# Patient Record
Sex: Female | Born: 1995 | Race: Black or African American | Hispanic: No | Smoking: Never smoker
Health system: Southern US, Community
[De-identification: ages and names within clinical notes are randomized; demographics above are authoritative.]

## PROBLEM LIST (undated history)

## (undated) ENCOUNTER — Inpatient Hospital Stay (HOSPITAL_COMMUNITY): Payer: Self-pay

## (undated) DIAGNOSIS — Z789 Other specified health status: Secondary | ICD-10-CM

## (undated) HISTORY — PX: NO PAST SURGERIES: SHX2092

---
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
## 2018-05-07 NOTE — L&D Delivery Note (Signed)
OB/GYN Faculty Practice Delivery Note  Madeline Garcia is a 23 y.o. G1P0 s/p VAVD at [redacted]w[redacted]d. She was admitted for post dates induction of labor. .   ROM: 5h 66m with clear fluid GBS Status:  Positive (07/14 0000), adequately treated Maximum Maternal Temperature: 99.5 F    Labor Progress: . Induced with cytotec x2, foley balloon, and pit, had SROM at 0600 on day of delivery, and progressed to fully  Delivery Date/Time: 12/23/2018 at 1132  Operative Delivery Note Infant was delivered via Vacuum Assisted Vaginal Delivery due to non-reassuring fetal heart tracing with prolonged decelerations.    Verbal consent: obtained from patient.  Risks and benefits discussed in detail.  Risks include, but are not limited to the risks of anesthesia, bleeding, infection, damage to maternal tissues, fetal cephalhematoma.  There is also the risk of inability to effect vaginal delivery of the head, or shoulder dystocia that cannot be resolved by established maneuvers, leading to the need for emergency cesarean section.  The patient was examined and thought to be ROA at +3 station.The Mityvac was positioned over the sagittal suture 3 cm anterior to posterior fontanelle.  Pressure was then increased to 400 mmHg, and the patient was instructed to push.  Pulling was administered along the pelvic curve.  2 pulls were administered during 1 contractions, with two pop offs at which the point the fetal forehead was identified and the Mityvac was reapplied to the sagittal suture anterior to the posterior fontanelle, and after 1 push without pop off the fetal head was delivered in LOP position. No nuchal cord present. The shoulders were identified to be in a transverse position, shoulder and bodies delivered with gentral traction. Infant with spontaneous cry, placed on mother's abdomen, dried and stimulated. Cord clamped x 2 after 1-minute delay, and cut by father. Cord blood drawn. Placenta delivered spontaneously with  gentle cord traction. Fundus firm with massage and Pitocin. Labia, perineum, vagina, and cervix inspected inspected with no lacertaions.   Placenta: intact, 3v Complications: none Lacerations: none EBL: 230cc Analgesia: epidural   Infant: APGAR (1 MIN): 7   APGAR (5 MINS): 9    Augustin Coupe, MD/[facility] OB/GYN Fellow, Water engineer

## 2018-05-13 ENCOUNTER — Encounter: Payer: Self-pay | Admitting: *Deleted

## 2018-06-06 ENCOUNTER — Inpatient Hospital Stay (HOSPITAL_COMMUNITY)
Admission: AD | Admit: 2018-06-06 | Discharge: 2018-06-07 | Disposition: A | Discharge status: Home / Self Care | Payer: Medicaid Other | Source: Ambulatory Visit | Attending: Family Medicine | Admitting: Family Medicine

## 2018-06-06 ENCOUNTER — Encounter (HOSPITAL_COMMUNITY): Payer: Self-pay | Admitting: *Deleted

## 2018-06-06 ENCOUNTER — Inpatient Hospital Stay (HOSPITAL_COMMUNITY): Payer: Medicaid Other

## 2018-06-06 DIAGNOSIS — R102 Pelvic and perineal pain: Secondary | ICD-10-CM | POA: Insufficient documentation

## 2018-06-06 DIAGNOSIS — O26891 Other specified pregnancy related conditions, first trimester: Secondary | ICD-10-CM | POA: Insufficient documentation

## 2018-06-06 DIAGNOSIS — Z3491 Encounter for supervision of normal pregnancy, unspecified, first trimester: Secondary | ICD-10-CM

## 2018-06-06 DIAGNOSIS — R109 Unspecified abdominal pain: Secondary | ICD-10-CM | POA: Diagnosis not present

## 2018-06-06 DIAGNOSIS — Z3A13 13 weeks gestation of pregnancy: Secondary | ICD-10-CM | POA: Insufficient documentation

## 2018-06-06 DIAGNOSIS — R1030 Lower abdominal pain, unspecified: Secondary | ICD-10-CM | POA: Insufficient documentation

## 2018-06-06 DIAGNOSIS — O219 Vomiting of pregnancy, unspecified: Secondary | ICD-10-CM

## 2018-06-06 HISTORY — DX: Other specified health status: Z78.9

## 2018-06-06 LAB — URINALYSIS, ROUTINE W REFLEX MICROSCOPIC
Bilirubin Urine: NEGATIVE
Glucose, UA: NEGATIVE mg/dL
Hgb urine dipstick: NEGATIVE
Ketones, ur: 15 mg/dL — AB
Leukocytes, UA: NEGATIVE
Nitrite: NEGATIVE
Protein, ur: NEGATIVE mg/dL
SPECIFIC GRAVITY, URINE: 1.025 (ref 1.005–1.030)
pH: 6 (ref 5.0–8.0)

## 2018-06-06 LAB — CBC
HCT: 34.3 % — ABNORMAL LOW (ref 36.0–46.0)
Hemoglobin: 11.4 g/dL — ABNORMAL LOW (ref 12.0–15.0)
MCH: 27.5 pg (ref 26.0–34.0)
MCHC: 33.2 g/dL (ref 30.0–36.0)
MCV: 82.7 fL (ref 80.0–100.0)
NRBC: 0 % (ref 0.0–0.2)
PLATELETS: 327 10*3/uL (ref 150–400)
RBC: 4.15 MIL/uL (ref 3.87–5.11)
RDW: 12.7 % (ref 11.5–15.5)
WBC: 8.6 10*3/uL (ref 4.0–10.5)

## 2018-06-06 LAB — WET PREP, GENITAL
Clue Cells Wet Prep HPF POC: NONE SEEN
Sperm: NONE SEEN
Trich, Wet Prep: NONE SEEN
Yeast Wet Prep HPF POC: NONE SEEN

## 2018-06-06 LAB — POCT PREGNANCY, URINE: Preg Test, Ur: POSITIVE — AB

## 2018-06-06 LAB — HCG, QUANTITATIVE, PREGNANCY: hCG, Beta Chain, Quant, S: 40531 m[IU]/mL — ABNORMAL HIGH (ref <5)

## 2018-06-06 NOTE — MAU Provider Note (Addendum)
History     CSN: [id number]  Arrival date and time: 06/06/18 2025   First Provider Initiated Contact with Patient 06/06/18 2120      Chief Complaint  Patient presents with  . Pelvic Pain  . Emesis During Pregnancy   Madeline Garcia is 23 y.o. G1P0 at [redacted]w[redacted]d by approximate LMP who presents today with lower abodminal pain. She denies any VB or discharge.   Pelvic Pain  The patient's primary symptoms include pelvic pain. The patient's pertinent negatives include no vaginal discharge. This is a new problem. Episode onset: 2 weeks ago. The problem occurs constantly. The problem has been unchanged. Pain severity now: 7/10. The problem affects both sides. She is pregnant. Associated symptoms include nausea. Pertinent negatives include no chills, dysuria, fever, frequency or vomiting. The vaginal discharge was normal. There has been no bleeding. Nothing aggravates the symptoms. She has tried nothing for the symptoms.    OB History    Gravida  1   Para      Term      Preterm      AB      Living        SAB      TAB      Ectopic      Multiple      Live Births              Past Medical History:  Diagnosis Date  . Medical history non-contributory     Past Surgical History:  Procedure Laterality Date  . NO PAST SURGERIES      History reviewed. No pertinent family history.  Social History   Tobacco Use  . Smoking status: Never Smoker  . Smokeless tobacco: Never Used  Substance Use Topics  . Alcohol use: Never    Frequency: Never  . Drug use: Never    Allergies: No Known Allergies  No medications prior to admission.    Review of Systems  Constitutional: Negative for chills and fever.  Gastrointestinal: Positive for nausea. Negative for vomiting.  Genitourinary: Positive for pelvic pain. Negative for dysuria, frequency, vaginal bleeding and vaginal discharge.   Physical Exam   Blood pressure 130/73, pulse (!) 107, temperature 99.1 F (37.3 C),  resp. rate 18, height 5\' 4"  (1.626 m), weight 113.4 kg.  Physical Exam  Nursing note and vitals reviewed. Constitutional: She is oriented to person, place, and time. She appears well-developed and well-nourished. No distress.  HENT:  Head: Normocephalic.  Cardiovascular: Normal rate.  Respiratory: Effort normal.  GI: Soft. There is no abdominal tenderness. There is no rebound.  Neurological: She is alert and oriented to person, place, and time.  Skin: Skin is warm and dry.  Psychiatric: She has a normal mood and affect.    Results for orders placed or performed during the hospital encounter of 06/06/18 (from the past 24 hour(s))  Urinalysis, Routine w reflex microscopic     Status: Abnormal   Collection Time: 06/06/18  8:54 PM  Result Value Ref Range   Color, Urine YELLOW YELLOW   APPearance CLEAR CLEAR   Specific Gravity, Urine 1.025 1.005 - 1.030   pH 6.0 5.0 - 8.0   Glucose, UA NEGATIVE NEGATIVE mg/dL   Hgb urine dipstick NEGATIVE NEGATIVE   Bilirubin Urine NEGATIVE NEGATIVE   Ketones, ur 15 (A) NEGATIVE mg/dL   Protein, ur NEGATIVE NEGATIVE mg/dL   Nitrite NEGATIVE NEGATIVE   Leukocytes, UA NEGATIVE NEGATIVE  Wet prep, genital     Status:  Abnormal   Collection Time: 06/06/18  8:54 PM  Result Value Ref Range   Yeast Wet Prep HPF POC NONE SEEN NONE SEEN   Trich, Wet Prep NONE SEEN NONE SEEN   Clue Cells Wet Prep HPF POC NONE SEEN NONE SEEN   WBC, Wet Prep HPF POC FEW (A) NONE SEEN   Sperm NONE SEEN   Pregnancy, urine POC     Status: Abnormal   Collection Time: 06/06/18  8:58 PM  Result Value Ref Range   Preg Test, Ur POSITIVE (A) NEGATIVE    MAU Course  Procedures  MDM 9:47 PM care turned over to [facility], CNM Labs and [place] pending   Thressa Sheller DNP, CNM  06/06/18  9:47 PM    MDM: [place] with live IUP, c/w LMP dates.  Wet prep and UA wnl, with 15 ketones in UA so some evidence of dehydration. Pt pain may be digestive, STD testing with [facility] pending.   Increase PO fluids, rest, heat, Tylenol PRN.  F/U with prenatal care as planned. Return to MAU for emergencies.  Assessment and Plan    1. Abdominal pain during pregnancy in first trimester   2. Pelvic pain in pregnancy, antepartum, first trimester   3. Normal IUP (intrauterine pregnancy) on prenatal ultrasound, first trimester     P: D/C home  Sharen Counter, CNM 12:06 AM

## 2018-06-06 NOTE — MAU Note (Addendum)
Pain in lower abd for 2 wks. Vomiting for 2 days. Pregnant but does not know LMP. Thinks she may be 3 months. Denies bleeding or vag d/c. No diarrhea. Pt had pos upt 05/01/18

## 2018-06-07 DIAGNOSIS — Z3A13 13 weeks gestation of pregnancy: Secondary | ICD-10-CM

## 2018-06-07 DIAGNOSIS — R109 Unspecified abdominal pain: Secondary | ICD-10-CM

## 2018-06-07 DIAGNOSIS — O26891 Other specified pregnancy related conditions, first trimester: Secondary | ICD-10-CM

## 2018-06-07 LAB — ABO/RH: ABO/RH(D): B POS

## 2018-06-07 MED ORDER — PROMETHAZINE HCL 25 MG PO TABS: 12.5000 mg | ORAL_TABLET | Freq: Four times a day (QID) | ORAL | 2 refills | Status: DC | PRN

## 2018-06-09 LAB — GC/CHLAMYDIA PROBE AMP (~~LOC~~) NOT AT ARMC
Chlamydia: NEGATIVE
Neisseria Gonorrhea: NEGATIVE

## 2018-06-10 ENCOUNTER — Other Ambulatory Visit: Payer: Self-pay

## 2018-06-10 ENCOUNTER — Encounter: Payer: Self-pay | Admitting: *Deleted

## 2018-06-10 ENCOUNTER — Ambulatory Visit (INDEPENDENT_AMBULATORY_CARE_PROVIDER_SITE_OTHER): Payer: Medicaid Other | Admitting: *Deleted

## 2018-06-10 ENCOUNTER — Ambulatory Visit: Payer: Self-pay | Admitting: Clinical

## 2018-06-10 VITALS — BP 128/73 | HR 106 | Wt 247.6 lb

## 2018-06-10 DIAGNOSIS — Z3401 Encounter for supervision of normal first pregnancy, first trimester: Secondary | ICD-10-CM

## 2018-06-10 DIAGNOSIS — Z3400 Encounter for supervision of normal first pregnancy, unspecified trimester: Secondary | ICD-10-CM | POA: Insufficient documentation

## 2018-06-10 NOTE — Progress Notes (Signed)
New Ob intake completed and pregnancy information packet given. Labs drawn and Medicaid Home form completed. Pt declined flu vaccine today. Information sheet given and will consider receiving at a later date. Pt had no c/o.  Initial prenatal visit scheduled on 2/19 @ 0955.

## 2018-06-10 NOTE — BH Specialist Note (Signed)
Integrated Behavioral Health Initial Visit  MRN: [id number] Name: Madeline Garcia  Number of Integrated Behavioral Health Clinician visits:: 1/6 Session Start time: 11:39  Session End time: 11:51 Total time: 15 minutes  Type of Service: Integrated Behavioral Health- Individual/Family Interpretor:No. Interpretor Name and Language: n/a   Warm Hand Off Completed.       SUBJECTIVE: Madeline Garcia is a 23 y.o. female accompanied by n/a Patient was referred by [place] Muta Day, RN for Initial OB introduction to integrated behavioral health services . Patient reports the following symptoms/concerns: Pt states no particular concerns today Duration of problem: n/a; Severity of problem: n/a  OBJECTIVE: Mood: Normal and Affect: Appropriate Risk of harm to self or others: No plan to harm self or others  LIFE CONTEXT: Family and Social: - School/Work: - Self-Care: - Life Changes: Current pregnancy  GOALS ADDRESSED: Patient will: 1. Increase knowledge and/or ability of: healthy habits   INTERVENTIONS: Interventions utilized: Psychoeducation and/or Health Education  Standardized Assessments completed: GAD-7 and PHQ 9  ASSESSMENT: Patient currently experiencing Supervision of low-risk first pregnancy, antepartum   Patient may benefit from Initial OB introduction to integrated behavioral health services.  PLAN: 1. Follow up with behavioral health clinician on : As needed 2. Behavioral recommendations:  -Begin taking prenatal vitamin, as recommended by medical provider 3. Referral(s): Integrated [employer] (In Clinic) 4. "From scale of 1-10, how likely are you to follow plan?": 10  Rae Lips, LCSW  Depression screen [facility] 2/9 06/10/2018  Decreased Interest 0  Down, Depressed, Hopeless 0  PHQ - 2 Score 0  Altered sleeping 1  Tired, decreased energy 1  Change in appetite 0  Feeling bad or failure about yourself  0  Trouble concentrating 0  Moving  slowly or fidgety/restless 0  Suicidal thoughts 0  PHQ-9 Score 2   GAD 7 : Generalized Anxiety Score 06/10/2018  Nervous, Anxious, on Edge 0  Control/stop worrying 0  Worry too much - different things 0  Trouble relaxing 0  Restless 0  Easily annoyed or irritable 0

## 2018-06-11 NOTE — Progress Notes (Signed)
Chart reviewed for nurse visit. Agree with plan of care.   Marny Lowenstein, PA-C 06/11/2018 12:08 PM

## 2018-06-12 LAB — URINE CULTURE, OB REFLEX

## 2018-06-12 LAB — CULTURE, OB URINE

## 2018-06-19 LAB — INHERITEST(R) CF/SMA PANEL

## 2018-06-19 LAB — OBSTETRIC PANEL, INCLUDING HIV
Antibody Screen: NEGATIVE
BASOS ABS: 0 10*3/uL (ref 0.0–0.2)
Basos: 0 %
EOS (ABSOLUTE): 0 10*3/uL (ref 0.0–0.4)
Eos: 0 %
HEP B S AG: NEGATIVE
HIV Screen 4th Generation wRfx: NONREACTIVE
Hematocrit: 34.9 % (ref 34.0–46.6)
Hemoglobin: 11.6 g/dL (ref 11.1–15.9)
Immature Grans (Abs): 0 10*3/uL (ref 0.0–0.1)
Immature Granulocytes: 0 %
Lymphocytes Absolute: 1.8 10*3/uL (ref 0.7–3.1)
Lymphs: 18 %
MCH: 27.6 pg (ref 26.6–33.0)
MCHC: 33.2 g/dL (ref 31.5–35.7)
MCV: 83 fL (ref 79–97)
Monocytes Absolute: 0.6 10*3/uL (ref 0.1–0.9)
Monocytes: 7 %
Neutrophils Absolute: 7.2 10*3/uL — ABNORMAL HIGH (ref 1.4–7.0)
Neutrophils: 75 %
Platelets: 375 10*3/uL (ref 150–450)
RBC: 4.2 x10E6/uL (ref 3.77–5.28)
RDW: 12.9 % (ref 11.7–15.4)
RPR Ser Ql: NONREACTIVE
Rh Factor: POSITIVE
Rubella Antibodies, IGG: 1.34 index (ref >0.99)
WBC: 9.7 10*3/uL (ref 3.4–10.8)

## 2018-06-19 LAB — HEMOGLOBINOPATHY EVALUATION
Ferritin: 110 ng/mL (ref 15–150)
Hgb A2 Quant: 2.3 % (ref 1.8–3.2)
Hgb A: 97.7 % (ref 96.4–98.8)
Hgb C: 0 %
Hgb F Quant: 0 % (ref 0.0–2.0)
Hgb S: 0 %
Hgb Solubility: NEGATIVE
Hgb Variant: 0 %

## 2018-06-25 ENCOUNTER — Encounter: Payer: Self-pay | Admitting: Medical

## 2018-06-25 ENCOUNTER — Other Ambulatory Visit: Payer: Self-pay

## 2018-06-25 ENCOUNTER — Other Ambulatory Visit (HOSPITAL_COMMUNITY)
Admission: RE | Admit: 2018-06-25 | Discharge: 2018-06-25 | Disposition: A | Discharge status: Home / Self Care | Payer: Medicaid Other | Source: Ambulatory Visit | Attending: Medical | Admitting: Medical

## 2018-06-25 ENCOUNTER — Ambulatory Visit (INDEPENDENT_AMBULATORY_CARE_PROVIDER_SITE_OTHER): Payer: Self-pay | Admitting: Medical

## 2018-06-25 DIAGNOSIS — Z3402 Encounter for supervision of normal first pregnancy, second trimester: Secondary | ICD-10-CM | POA: Diagnosis not present

## 2018-06-25 DIAGNOSIS — Z3A15 15 weeks gestation of pregnancy: Secondary | ICD-10-CM

## 2018-06-25 DIAGNOSIS — O99212 Obesity complicating pregnancy, second trimester: Secondary | ICD-10-CM | POA: Insufficient documentation

## 2018-06-25 NOTE — Progress Notes (Signed)
Medicaid Form Completed-06/25/18 

## 2018-06-25 NOTE — Patient Instructions (Signed)
Childbirth Education Options: [facility] Department Classes:  Childbirth education classes can help you get ready for a positive parenting experience. You can also meet other expectant parents and get free stuff for your baby. Each class runs for five weeks on the same night and costs $45 for the mother-to-be and her support person. Medicaid covers the cost if you are eligible. Call [phone] to register. [facility] Childbirth Education:  [phone] or [phone] or [email or web address]  Baby & Me Class: Discuss newborn & infant parenting and family adjustment issues with other new mothers in a relaxed environment. Each week brings a new speaker or baby-centered activity. We encourage new mothers to join [place] every Thursday at 11:00am. Babies birth until crawling. No registration or fee. Daddy [employer]: This course offers Dads-to-be the tools and knowledge needed to feel confident on their journey to becoming new fathers. Experienced dads, who have been trained as coaches, teach dads-to-be how to hold, comfort, diaper, swaddle and play with their infant while being able to support the new mom as well. A class for men taught by men. $25/dad Big Brother/Big Sister: Let your children share in the joy of a new brother or sister in this special class designed just for them. Class includes discussion about how families care for babies: swaddling, holding, diapering, safety as well as how they can be helpful in their new role. This class is designed for children ages 45 to 48, but any age is welcome. Please register each child individually. $5/child  Mom Talk: This mom-led group offers support and connection to mothers as they journey through the adjustments and struggles of that sometimes overwhelming first year after the birth of a child. Tuesdays at 10:00am and Thursdays at 6:00pm. Babies welcome. No registration or fee. Breastfeeding Support Group: This group is a mother-to-mother  support circle where moms have the opportunity to share their breastfeeding experiences. A Lactation Consultant is present for questions and concerns. Meets each Tuesday at 11:00am. No fee or registration. Breastfeeding Your Baby: Learn what to expect in the first days of breastfeeding your newborn.  This class will help you feel more confident with the skills needed to begin your breastfeeding experience. Many new mothers are concerned about breastfeeding after leaving the hospital. This class will also address the most common fears and challenges about breastfeeding during the first few weeks, months and beyond. (call for fee) Comfort Techniques and Tour: This 2 hour interactive class will provide you the opportunity to learn & practice hands-on techniques that can help relieve some of the discomfort of labor and encourage your baby to rotate toward the best position for birth. You and your partner will be able to try a variety of labor positions with birth balls and rebozos as well as practice breathing, relaxation, and visualization techniques. A tour of the [facility] is included with this class. $20 per registrant and support person Childbirth Class- Weekend Option: This class is a Weekend version of our Birth & Baby series. It is designed for parents who have a difficult time fitting several weeks of classes into their schedule. It covers the care of your newborn and the basics of labor and childbirth. It also includes a [place] of [facility] and lunch. The class is held two consecutive days: beginning on Friday evening from 6:30 - 8:30 p.m. and the next day, Saturday from 9 a.m. - 4 p.m. (call for fee) Doren Custard Class: Interested in a waterbirth?  This  informational class will help you discover whether waterbirth is the right fit for you. Education about waterbirth itself, supplies you would need and how to assemble your support team is what you can  expect from this class. Some obstetrical practices require this class in order to pursue a waterbirth. (Not all obstetrical practices offer waterbirth-check with your healthcare provider.) Register only the expectant mom, but you are encouraged to bring your partner to class! Required if planning waterbirth, no fee. Infant/Child CPR: Parents, grandparents, babysitters, and friends learn Cardio-Pulmonary Resuscitation skills for infants and children. You will also learn how to treat both conscious and unconscious choking in infants and children. This Family & Friends program does not offer certification. Register each participant individually to ensure that enough mannequins are available. (Call for fee) Grandparent Love: Expecting a grandbaby? This class is for you! Learn about the latest infant care and safety recommendations and ways to support your own child as he or she transitions into the parenting role. Taught by Registered Nurses who are childbirth instructors, but most importantly...they are grandmothers too! $10/person. Childbirth Class- Natural Childbirth: This series of 5 weekly classes is for expectant parents who want to learn and practice natural methods of coping with the process of labor and childbirth. Relaxation, breathing, massage, visualization, role of the partner, and helpful positioning are highlighted. Participants learn how to be confident in their body's ability to give birth. This class will empower and help parents make informed decisions about their own care. Includes discussion that will help new parents transition into the immediate postpartum period. Maternity Care Center Tour of [facility] is included. We suggest taking this class between 25-32 weeks, but it's only a recommendation. $75 per registrant and one support person or $30 Medicaid. Childbirth Class- 3 week Series: This option of 3 weekly classes helps you and your labor partner prepare for childbirth. Newborn  care, labor & birth, cesarean birth, pain management, and comfort techniques are discussed and a Maternity Care Center Tour of [facility] is included. The class meets at the same time, on the same day of the week for 3 consecutive weeks beginning with the starting date you choose. $60 for registrant and one support person.  Marvelous Multiples: Expecting twins, triplets, or more? This class covers the differences in labor, birth, parenting, and breastfeeding issues that face multiples' parents. NICU tour is included. Led by a Certified Childbirth Educator who is the mother of twins. No fee. Caring for Baby: This class is for expectant and adoptive parents who want to learn and practice the most up-to-date newborn care for their babies. Focus is on birth through the first six weeks of life. Topics include feeding, bathing, diapering, crying, umbilical cord care, circumcision care and safe sleep. Parents learn to recognize symptoms of illness and when to call the pediatrician. Register only the mom-to-be and your partner or support person can plan to come with you! $10 per registrant and support person Childbirth Class- online option: This online class offers you the freedom to complete a Birth and Baby series in the comfort of your own home. The flexibility of this option allows you to review sections at your own pace, at times convenient to you and your support people. It includes additional video information, animations, quizzes, and extended activities. Get organized with helpful eClass tools, checklists, and trackers. Once you register online for the class, you will receive an email within a few days to accept the invitation and begin the class when the time   is right for you. The content will be available to you for 60 days. $60 for 60 days of online access for you and your support people.  Local Doulas: Natural Baby Doulas [email or web address] Tel:  [phone] [email or web address] [employer] [phone] [email or web address] .[email or web address].[email or web address] The Labor Hassell Halim  (also do waterbirth tub rental) [phone] [email or web address] Triad Birth Doula [phone] [email or web address] NotebookDistributors.fi Sacred Rhythms  [phone] https://sacred-rhythms.com/ [employer] (PADA) [email or web address] https://www.frey.org/ [place] Birth and Baby  http://[email or web address]/ Considering Waterbirth? Guide for patients at [facility] [employer]  Why consider waterbirth?  . Gentle birth for babies . Less pain medicine used in labor . May allow for passive descent/less pushing . May reduce perineal tears  . More mobility and instinctive maternal position changes . Increased maternal relaxation . Reduced blood pressure in labor  Is waterbirth safe? What are the risks of infection, drowning or other complications?  . Infection: o Very low risk (3.7 % for tub vs 4.8% for bed) o 7 in 8000 waterbirths with documented infection o Poorly cleaned equipment most common cause o Slightly lower group B strep transmission rate  . Drowning o Maternal:  - Very low risk   - Related to seizures or fainting o Newborn:  - Very low risk. No evidence of increased risk of respiratory problems in multiple large studies - Physiological protection from breathing under water - Avoid underwater birth if there are any fetal complications - Once baby's head is out of the water, keep it out.  . Birth complication o Some reports of cord trauma, but risk decreased by bringing baby to surface gradually o No evidence of increased risk of shoulder dystocia. Mothers can usually change positions faster in water than in a bed, possibly aiding the maneuvers to free the shoulder.   You must attend a Doren Custard class at [facility]  3rd Wednesday of every month from 7-9pm  [employer] by calling [phone] or online at [email or web address]  Bring [place] the certificate from the class to your prenatal appointment  Meet with a midwife at 36 weeks to see if you can still plan a waterbirth and to sign the consent.   Purchase or rent the following supplies:   Water Birth Pool (Birth Pool in a Box or [place] for instance)  (Tubs start ~$125)  Single-use disposable tub liner designed for your brand of tub  New garden hose labeled "lead-free", "suitable for drinking water",  Electric drain pump to remove water (We recommend 792 gallon per hour or greater pump.)   Separate garden hose to remove the dirty water  Fish net  Bathing suit top (optional)  Long-handled mirror (optional)  Places to purchase or rent supplies  GotWebTools.is for tub purchases and supplies  Waterbirthsolutions.com for tub purchases and supplies  The Labor Ladies (www.thelaborladies.com) $275 for tub rental/set-up & take down/kit   [employer] (http://www.fleming.com/.htm) Information regarding doulas (labor support) who provide pool rentals  Our practice has a Birth Pool in a Box tub at the hospital that you may borrow on a first-come-first-served basis. It is your responsibility to to set up, clean and break down the tub. We cannot guarantee the availability of this tub in advance. You are responsible for bringing all accessories listed above. If you do not have all necessary supplies you cannot have a waterbirth.    Things that would prevent you from having a waterbirth:  Premature, <37wks  Previous cesarean birth  Presence of thick meconium-stained fluid  Multiple gestation (Twins,  triplets, etc.)  Uncontrolled diabetes or gestational diabetes requiring medication  Hypertension requiring medication or diagnosis of pre-eclampsia  Heavy vaginal bleeding  Non-reassuring fetal  heart rate  Active infection (MRSA, etc.). Group B Strep is NOT a contraindication for  waterbirth.  If your labor has to be induced and induction method requires continuous  monitoring of the baby's heart rate  Other risks/issues identified by your obstetrical provider  Please remember that birth is unpredictable. Under certain unforeseeable circumstances your provider may advise against giving birth in the tub. These decisions will be made on a case-by-case basis and with the safety of you and your baby as our highest priority.   AREA PEDIATRIC/FAMILY PRACTICE PHYSICIANS  [place]outheast [employer] [phone]) . [facility] Davy Pique, MD; Gwendlyn Deutscher, MD; Walker Kehr, MD; Andria Frames, MD; McDiarmid, MD; Dutch Quint, MD; Nori Riis, MD; Mingo Amber, [place] [place], [place] [address] o [phone] o Mon-Fri 8:30-12:30, 1:30-5:00 o Providers come to see babies at [facility] o Accepting Medicaid . [place] [facility] [place] providers who accept newborns: Dorthy Cooler, MD; Orland Mustard, MD; Stephanie Acre, MD o [place], [place], [place] [address] o [phone] o Mon-Fri 8:00-5:30 o Babies seen by providers at [facility] o Does NOT accept Medicaid o Please call early in hospitalization for appointment (limited availability)  . [place]ard Royal Pines, MD o [address] [place], [place] [address] o [phone] o Mon, Tue, Thur, Fri 8:30-5:00, Wed 10:00-7:00 (closed 1-2pm) o Babies seen by [facility] providers o Accepting Medicaid . [place], MD o [place], [place], [place] [address] o [phone] o Mon-Fri 8:30-5:00, Sat 8:30-12:00 o Provider comes to see babies at [place] Medicaid o Must have been referred from current patients or contacted office prior to delivery . [place] [facility] ([place] for  [place]) Franne Forts, MD; Tamera Punt, MD; Doneen Poisson, MD; Fatima Sanger, MD; Wynetta Emery, MD; Jess Barters, MD; Tami Ribas, MD; Herbert Moors, MD; Derrell Lolling, MD; Dorothyann Peng, MD; Lucious Groves, NP; Baldo Ash, NP o [place], [place], [place] [address] o [phone] o Mon, Tue, Thur, Fri 8:30-5:30, Wed 9:30-5:30, Sat 8:30-12:30 o Babies seen by [facility] providers o Accepting Medicaid o Only accepting infants of first-time parents or siblings of current patients [facility] discharge coordinator will make follow-up appointment . Baltazar Najjar o [address], [place], [place]  [address] o [phone]   Fax - [phone] . [facility] o [address], [place], [place]  [address] o Phone - [phone]   Fax [phone] . [place], [place], [place], [place]  [address] o [phone]  [place] [phone]) . [facility] Reginal Lutes, MD; Jacklynn Ganong, MD; Torrie Mayers, MD; MD; Rosana Hoes, MD; Servando Salina, MD; Rose Fillers, MD; Rex Kras, MD; Corinna Capra, MD; Volney American, MD; Trilby Drummer, MD; Janann Colonel, MD; Jimmye Norman, [place], [place], [place] [address] o [phone] o Mon-Fri 8:30-5:00 (extended evenings Mon-Thur as needed), Sat-Sun 10:00-1:00 o Providers come to see babies at [place] Medicaid for families of first-time babies and families with all children in the household age 20 and under. Must register with office prior to making appointment (M-F only). . [place], NP; Tomi Bamberger, MD; Redmond School, MD; [place], [place] [place], [place] [address] o [phone] o Mon-Fri 8:00-5:00 o Babies seen by providers at [facility] o Does NOT accept  Medicaid/Commercial Insurance Only . [facility] [place] ([facility])  Marnee Guarneri, MD; Drema Dallas, MD; Montine Circle, MD; Vilma Prader, MD; Vanita Panda, MD; Alfonso Ramus, MD; Ruthann Cancer, MD; Roxanne Mins, MD; Rosalva Ferron, MD; Polly Cobia, MD o [address]  [place], [place] [address] o [phone] o Mon-Fri 8:30-5:30, Sat (Oct.-Mar.) 9:00-1:00 o Babies seen by providers at [place] [phone]) . [facility] Peers, MD; Suzan Slick, MD o [address], [place], [place] [address] o [phone] o Mon-Fri 8:30-5:00, Sat 8:30-12:00 o Providers come to see babies at [facility] o Does NOT accept Medicaid . [place] at [place], [place]; [place], MD; [place], [place]; Nancy Fetter, MD; Moreen Fowler, [place], [place], [place] [address] o [phone] o Mon-Fri 8:00-5:00 o Babies seen by providers at [facility] o Does NOT accept Medicaid o Only accepting babies of parents who are patients o Please call early in hospitalization for appointment (limited availability) . [facility] Pediatricians Blanca Friend, MD; Sharlene Motts, MD; Rod Can, MD; Warner Mccreedy, NP; Sabra Heck, MD; Ermalinda Memos, MD; Sharlett Iles, NP; Aurther Loft, MD; Jerrye Beavers, MD; Marcello Moores, MD; Berline Lopes, MD; Charolette Forward, MD o [place]. [place], [place], [place] [address] o [phone] o Mon-Fri 8:00-5:00, Sat 9:00-12:00 o Providers come to see babies at [facility] o Does NOT accept [facility] [phone]) . [place] [facility] [place] providers accepting new patients: Dayna Ramus, NP; [place], [place], [place], [place] [address] o [phone] o Mon-Fri 8:00-5:00 o Babies seen by providers at [facility] o Does NOT accept Medicaid o Only accepting babies of parents who are patients o Please call early in hospitalization for appointment (limited availability) . [facility] Oswaldo Conroy, MD; Sheran Lawless, MD o [address] [place], [place] [address] o [phone] (press 1 to schedule appointment) o Mon-Fri 8:00-5:00 o Providers come to see babies at [facility] o Does NOT accept Medicaid . [facility] Jodi Mourning, MD o [address], [place], [place]  [address] o [phone] o Mon-Fri 8:30-5:00 (lunch 12:30-1:00), extended hours by appointment only Wed 5:00-6:30 o Babies seen by [facility] providers o Accepting Medicaid . [place] at Evalyn Casco, MD; [place], MD; Ethlyn Gallery, MD o [place], [place], [place] [address] o [phone] o Mon-Fri 8:00-5:00 o Babies seen by [facility] providers o Does NOT accept Medicaid . Therapist, music at [place], MD; Yong Channel, MD; [place], [place] [address] [place], [place] [address] o [phone] o Mon-Fri 8:00-5:00 o Babies seen by [facility] providers o Does NOT accept Medicaid . [place], [place]; [place], [place]; Wheeler, NP; Albertina Parr, MD; Frederic Jericho, MD; Ronney Lion, MD; Carlos Levering, NP; Jerelene Redden, NP; Tomasita Crumble, NP; Ronelle Nigh, NP; Corinna Lines, MD; Windber, MD o [address] [place], [place] [address] o [phone] o Mon-Fri 8:30-5:00, Sat 10:00-1:00 o Providers come to see babies at [facility] o Does NOT accept Medicaid o Free prenatal information session Tuesdays at 4:45pm . [facility] Porfirio Oar, MD; [place], [place]; [place], [place]; [place], [place] [place] [address] o [phone] o Mon-Fri 7:30-5:30 o Babies seen by [facility] providers . [facility] Children's Doctor o [address] [place], [place],   [address] o [phone]   Fax - [phone]  [place] [phone] & [phone]) . [place], MD o [address] [place],  [address] o [phone] o Mon-Thur  8:00-6:00 o Providers come to see babies at [facility] o Accepting Medicaid . [facility] o Anderson, NP; Badger, MD; Beal, PA; Spencer, PA o [address] [place], [place] [address] o [phone] o Mon-Thur 7:30-7:30, Fri 7:30-4:30 o Babies seen by [facility] providers o Accepting Medicaid . [facility] o Agbuya, MD; Klett,  NP; Romgoolam, MD o [address], [place], [place] [address] o [phone] o Mon-Fri 8:30-5:00, Sat 8:30-12:00 o Providers come to see babies at [facility] o Accepting Medicaid o Must have "Meet & Greet" appointment at office prior to delivery . [facility] ([facility]) o McCord, MD; [place], MD; Wood, MD o [address], [place], [place] [address] o [phone] o Mon-Wed 8:00-6:00, Thur-Fri 8:00-5:00, Sat 9:00-12:00 o Providers come to see babies at [facility] o Does NOT accept Medicaid o Only accepting siblings of current patients . [facility]  o [address], [place], [place]  [address] o [phone]   Fax - [phone] . [facility] [place] o [address], [place], [place]  [address] o [phone]   Fax - [phone]  [place] ([address] & [address]) . [facility] [place] o Cirigliano, DO; Matthews, DO o [address] [place], [place] [address] o [phone] o Mon-Fri 7:00-5:00 o Babies seen by [facility] providers o Does NOT accept Medicaid . [facility] o Briscoe, MD; Howley, PA; Moreira, PA o [address], [place], [place] [address] o [phone] o Mon-Fri 8:00-5:00 o Babies seen by [facility] providers o Accepting Medicaid . [facility] o Boyd, MD; Church, PA; Jones, NP; Osborn, PA o [address], [place], [place] [address] o [phone] o Mon-Fri 8:00-5:00 o Babies seen by providers at [facility] o Accepting Medicaid  [place] ([address]) . [facility] [place] o Wendling, DO o [address] [place], [place] [address] o [phone] o Mon-Fri 8:00-5:00 o Babies seen by [facility] providers o Does NOT accept  Medicaid o Limited availability, please call early in hospitalization to schedule follow-up . [facility] o Calderon, PA; Cummings, MD; Dillard, MD; Martin, PA; Olson, MD; VanDeven, PA o [address], [place], [place] [address] o [phone] o Mon-Fri 8:30-5:00, Sat 9:00-12:00 o Babies seen by providers at [facility] o Accepting Medicaid o Please register online then schedule online or call office o [email or web address] . [facility] ([facility] o Hunter, NP; Kumar, MD; Martin Rogers, PA o [address], [place], [place] [address] o [phone] o Mon-Fri 8:00-5:00 o Babies seen by providers at [facility] o Accepting Medicaid . [facility] ([facility]) o Palomas, MD; Kristi Fleenor, NP; [place], MD o [address], [place], [place] [address] o [phone] o Mon-Fri 8:00-5:30, Sat&Sun by appointment (phones open at 8:30) o Babies seen by [facility] providers o Accepting Medicaid o Must be a first-time baby or sibling of current patient . [facility] [place]  o [address], [place],   [address] o [phone]   Fax - [phone]  [place] ([address]) . [facility] o Brown, PA; [place], PA; Rice, MD; Helton, PA; Spry, MD o [address] [place], [place] [address] o [phone] o Mon-Thur 8:00-7:00, Fri 8:00-5:00, Sat 8:00-12:00, Sun 9:00-12:00 o Babies seen by [facility] providers o Accepting Medicaid . [facility] at [facility], MD; Ruthann Cancer, MD; [facility], MD o [address], [place], [place] [address] o [phone] o Mon-Thur 8:00-5:00 o Babies seen by providers at [facility] o Accepting Medicaid . [facility] at [place], MD; Coe-Goins, MD; Amedeo Plenty,  MD; Bobby Rumpf, MD; List, MD; Lavonia Drafts, MD; Ruthann Cancer, MD; Selinda Eon, MD; Audie Box, MD; Jim Like, MD; Christie Nottingham, MD; Hubbard Hartshorn, MD; Modena Nunnery, MD o [address] [place], [place] [address] o [phone] o Mon-Fri 8:00-5:30, Sat (Oct.-Mar.) 9:00-1:00 o Babies seen by providers at [facility] o Accepting Medicaid o Must fill out new patient packet, available online at [email or web address] . [place] ([facility] at [facility]) Barnabas Lister, NP; Kenton Kingfisher, NP; Claiborne Billings, NP; Rolla Plate, MD; [place], [place]; Carola Rhine, MD; Tyron Russell, MD; Delia Chimes, NP o [address], [place], [place] [address] o [phone] o Mon-Thur 8:00-5:30, Fri 8:00-5:00 o Babies seen by providers at [place] [phone]) . [place], [place]; [place], MD; Dennard Schaumann, MD; [place], [place] o [address] [place], [place] [address] o [phone] o Mon-Fri 8:00-5:00 o Babies seen by providers at [place] [phone]) . [place] at [place], [place]; Olen Pel, MD; [place], [place], [place], [place] [address] o [phone] o Mon-Fri 8:00-5:00 o Babies seen by providers at [facility] o Does NOT accept Medicaid o Limited appointment availability, please call early in hospitalization  . Therapist, music at [place], [place]; [place], [address] [place], [place] [address] o [phone] o Mon-Fri 8:00-5:00 o Babies seen by [facility] providers o Does NOT accept Medicaid . [facility] Su Grand, MD; Guy Sandifer, MD; [place], [place]; [place], [place], [place], [place] [address] o [phone] o Mon-Fri 8:00-5:00 o After hours clinic [facility] [place], [place] [address]) [phone] Mon-Fri 5:00-8:00, Sat 12:00-6:00, Sun 10:00-4:00 o Babies seen by [facility]  providers o Accepting Medicaid . [place] at [facility] o [address], [place], [place]  [address] o [phone]   Fax - [phone]  [place] [phone]) . Therapist, music at [facility], MD o [address] [place], [place], [place] [address] o [phone] o Mon-Fri 8:00-5:00 o Babies seen by [facility] providers o Does NOT accept Medicaid . [place] ([place] at [place]) Bing Neighbors, MD o [address] [place], [place], [place] [address] o [phone] o Mon-Thur 8:00-7:00, Fri 8:00-5:00, Sat 8:00-12:00 o Babies seen by providers at [facility] o Accepting Medicaid - but does not have vaccinations in office (must be received elsewhere) o Limited availability, please call early in hospitalization  [place] ([id number]) . [place], [place] [address], [place] [address] o [phone]  Fax [phone]

## 2018-06-25 NOTE — Progress Notes (Signed)
   PRENATAL VISIT NOTE  Subjective:  Madeline Garcia is a 23 y.o. G1P0 at [redacted]w[redacted]d being seen today for her first prenatal care visit.  She had RN new OB intake previously. She is currently monitored for the following issues for this low-risk pregnancy and has Supervision of low-risk first pregnancy and Obesity affecting pregnancy in second trimester on their problem list.  Patient reports nausea and abdominal pain.  Contractions: Not present. Vag. Bleeding: None.  Movement: Absent. Denies leaking of fluid.   The following portions of the patient's history were reviewed and updated as appropriate: allergies, current medications, past family history, past medical history, past social history, past surgical history and problem list. Problem list updated.  Objective:   Vitals:   06/25/18 1009  BP: 129/79  Pulse: (!) 115  Weight: 247 lb 12.8 oz (112.4 kg)    Fetal Status: Fetal Heart Rate (bpm): 156   Movement: Absent     General:  Alert, oriented and cooperative. Patient is in no acute distress.  Skin: Skin is warm and dry. No rash noted.   Cardiovascular: Normal heart rate and rhythm noted  Respiratory: Normal respiratory effort, no problems with respiration noted. Clear to auscultation.   Abdomen: Soft, gravid, appropriate for gestational age. Non tender. Normal bowel sounds  Pain/Pressure: Present     Pelvic: Cervical exam performed Dilation: Closed Effacement (%): Thick    Extremities: Normal range of motion.  Edema: None  Mental Status: Normal mood and affect. Normal behavior. Normal judgment and thought content.   Assessment and Plan:  Pregnancy: G1P0 at [redacted]w[redacted]d  1. Encounter for supervision of low-risk first pregnancy in second trimester - normal labs reviewed from NOB intake visit  - Genetic Screening - Cytology - PAP - [place] MFM OB COMP + 14 WK; scheduled - AFP, Serum, Open Spina Bifida  2. Obesity affecting pregnancy in second trimester - Recommended 81 mg ASA daily -  Discussed appropriate weight gain goals for this pregnancy  Preterm labor/second trimester warning symptoms and general obstetric precautions including but not limited to vaginal bleeding, contractions, leaking of fluid and fetal movement were reviewed in detail with the patient. Please refer to After Visit Summary for other counseling recommendations.  Return in about 4 weeks (around 07/23/2018) for LOB.  Future Appointments  Date Time Provider Department Center  07/22/2018 10:45 AM WH-MFC [place] 2 WH-MFCUS MFC-US    Vonzella Nipple, PA-C

## 2018-06-26 LAB — CYTOLOGY - PAP: DIAGNOSIS: NEGATIVE

## 2018-06-27 LAB — AFP, SERUM, OPEN SPINA BIFIDA
AFP MOM: 0.97
AFP VALUE AFPOSL: 22.9 ng/mL
Gest. Age on Collection Date: 15 weeks
Maternal Age At EDD: 25.2 yr
OSBR Risk 1 IN: 10000
Test Results:: NEGATIVE
WEIGHT: 247 [lb_av]

## 2018-07-04 ENCOUNTER — Telehealth: Payer: Self-pay

## 2018-07-04 NOTE — Telephone Encounter (Signed)
Received call from Pt thru Nurse Line on 07/03/18 stating that she has not received information regarding her babys gender yet.Asked for call back at [phone].

## 2018-07-04 NOTE — Telephone Encounter (Signed)
Called Pt to advise that her Panorama results has not been received , can take up to 7 to 14 days, no answer, left VM.

## 2018-07-08 ENCOUNTER — Encounter: Payer: Self-pay | Admitting: General Practice

## 2018-07-15 ENCOUNTER — Encounter (HOSPITAL_COMMUNITY): Payer: Self-pay

## 2018-07-22 ENCOUNTER — Other Ambulatory Visit: Payer: Self-pay | Admitting: Medical

## 2018-07-22 ENCOUNTER — Ambulatory Visit (HOSPITAL_COMMUNITY)
Admission: RE | Admit: 2018-07-22 | Discharge: 2018-07-22 | Disposition: A | Discharge status: Home / Self Care | Payer: Medicaid Other | Source: Ambulatory Visit | Attending: Medical | Admitting: Medical

## 2018-07-22 ENCOUNTER — Other Ambulatory Visit: Payer: Self-pay

## 2018-07-22 ENCOUNTER — Encounter: Payer: Self-pay | Admitting: *Deleted

## 2018-07-22 ENCOUNTER — Other Ambulatory Visit (HOSPITAL_COMMUNITY): Payer: Self-pay | Admitting: *Deleted

## 2018-07-22 DIAGNOSIS — O99212 Obesity complicating pregnancy, second trimester: Secondary | ICD-10-CM

## 2018-07-22 DIAGNOSIS — Z3402 Encounter for supervision of normal first pregnancy, second trimester: Secondary | ICD-10-CM | POA: Diagnosis present

## 2018-07-22 DIAGNOSIS — Z3A19 19 weeks gestation of pregnancy: Secondary | ICD-10-CM

## 2018-07-22 DIAGNOSIS — Z363 Encounter for antenatal screening for malformations: Secondary | ICD-10-CM

## 2018-07-22 DIAGNOSIS — Z362 Encounter for other antenatal screening follow-up: Secondary | ICD-10-CM

## 2018-07-23 ENCOUNTER — Encounter: Payer: Self-pay | Admitting: Medical

## 2018-07-23 ENCOUNTER — Ambulatory Visit (INDEPENDENT_AMBULATORY_CARE_PROVIDER_SITE_OTHER): Payer: Medicaid Other | Admitting: Medical

## 2018-07-23 ENCOUNTER — Telehealth: Payer: Self-pay

## 2018-07-23 VITALS — BP 130/76 | HR 107 | Temp 98.4°F | Wt 249.8 lb

## 2018-07-23 DIAGNOSIS — Z3402 Encounter for supervision of normal first pregnancy, second trimester: Secondary | ICD-10-CM

## 2018-07-23 DIAGNOSIS — O99212 Obesity complicating pregnancy, second trimester: Secondary | ICD-10-CM

## 2018-07-23 DIAGNOSIS — Z3A19 19 weeks gestation of pregnancy: Secondary | ICD-10-CM

## 2018-07-23 NOTE — Patient Instructions (Addendum)

## 2018-07-23 NOTE — Telephone Encounter (Signed)
Called pt to see if she's coming to appt. Today or would like to reschedule. No answer, left VM with current check in process.

## 2018-07-23 NOTE — Progress Notes (Signed)
   PRENATAL VISIT NOTE  Subjective:  Madeline Garcia is a 23 y.o. G1P0 at [redacted]w[redacted]d being seen today for ongoing prenatal care.  She is currently monitored for the following issues for this low-risk pregnancy and has Supervision of low-risk first pregnancy and Obesity affecting pregnancy in second trimester on their problem list.  Patient reports lower abdominal pain.  Contractions: Not present. Vag. Bleeding: None.  Movement: Present. Denies leaking of fluid.   The following portions of the patient's history were reviewed and updated as appropriate: allergies, current medications, past family history, past medical history, past social history, past surgical history and problem list.   Objective:   Vitals:   07/23/18 1602  BP: 130/76  Pulse: (!) 107  Temp: 98.4 F (36.9 C)  Weight: 249 lb 12.8 oz (113.3 kg)    Fetal Status: Fetal Heart Rate (bpm): 152   Movement: Present     General:  Alert, oriented and cooperative. Patient is in no acute distress.  Skin: Skin is warm and dry. No rash noted.   Cardiovascular: Normal heart rate noted  Respiratory: Normal respiratory effort, no problems with respiration noted  Abdomen: Soft, gravid, appropriate for gestational age.  Pain/Pressure: Present     Pelvic: Cervical exam deferred        Extremities: Normal range of motion.  Edema: None  Mental Status: Normal mood and affect. Normal behavior. Normal judgment and thought content.   Assessment and Plan:  Pregnancy: G1P0 at [redacted]w[redacted]d 1. Encounter for supervision of low-risk first pregnancy in second trimester - Encouraged patient to call ahead if experiencing respiratory symptoms with fever so that she can be triaged and tested/directed appropriately   2. Obesity affecting pregnancy in second trimester - ASA recommended daily  - FU growth scheduled 4/14  Preterm labor/second trimester warning symptoms and general obstetric precautions including but not limited to vaginal bleeding,  contractions, leaking of fluid and fetal movement were reviewed in detail with the patient. Please refer to After Visit Summary for other counseling recommendations.   Return in about 8 weeks (around 09/17/2018) for LOB.  Future Appointments  Date Time Provider Department Center  08/19/2018  3:45 PM WH-MFC [place] 2 WH-MFCUS MFC-US    Vonzella Nipple, PA-C

## 2018-07-29 ENCOUNTER — Encounter: Payer: Self-pay | Admitting: *Deleted

## 2018-08-04 ENCOUNTER — Telehealth: Payer: Self-pay | Admitting: Medical

## 2018-08-04 NOTE — Telephone Encounter (Signed)
Attempted to call patient with her virtual ob appointment. No answer, lvm with the appointment time and date. Advised to give the office a call if needing to reschedule.

## 2018-08-06 ENCOUNTER — Telehealth: Payer: Self-pay | Admitting: *Deleted

## 2018-08-06 NOTE — Telephone Encounter (Signed)
Received a voice message from [place] at [facility] that she needs to know the EDD of this patient and if she is working with these restrictions for duration of pregnancy.

## 2018-08-07 NOTE — Telephone Encounter (Signed)
Was there a phone number? If not how can I contact her back?

## 2018-08-13 ENCOUNTER — Telehealth: Payer: Self-pay | Admitting: Emergency Medicine

## 2018-08-13 NOTE — Telephone Encounter (Signed)
Received a call from [place] from [employer], the employer for [place], regarding her work restriction letter. There was clarification needed regarding prolonged standing and limited bending.   After consulting with Dr Macon Large, the call was returned to [facility] for clarification. I clarified that in regards to the letter, the patient should take a break every two hours so that can be applied to prolonged standing. I also clarified that the pt could not bend to lift anything 20 pounds or greater. No further questions at the time of this discussion.

## 2018-08-14 NOTE — Telephone Encounter (Signed)
Completed on 08/13/18 see new encounter

## 2018-08-19 ENCOUNTER — Other Ambulatory Visit: Payer: Self-pay

## 2018-08-19 ENCOUNTER — Ambulatory Visit (HOSPITAL_COMMUNITY)
Admission: RE | Admit: 2018-08-19 | Discharge: 2018-08-19 | Disposition: A | Discharge status: Home / Self Care | Payer: Medicaid Other | Source: Ambulatory Visit | Attending: Obstetrics and Gynecology | Admitting: Obstetrics and Gynecology

## 2018-08-19 DIAGNOSIS — Z3A23 23 weeks gestation of pregnancy: Secondary | ICD-10-CM | POA: Diagnosis not present

## 2018-08-19 DIAGNOSIS — O99212 Obesity complicating pregnancy, second trimester: Secondary | ICD-10-CM | POA: Diagnosis not present

## 2018-08-19 DIAGNOSIS — Z362 Encounter for other antenatal screening follow-up: Secondary | ICD-10-CM

## 2018-08-26 ENCOUNTER — Encounter: Payer: Self-pay | Admitting: Advanced Practice Midwife

## 2018-08-26 ENCOUNTER — Ambulatory Visit (INDEPENDENT_AMBULATORY_CARE_PROVIDER_SITE_OTHER): Payer: Medicaid Other | Admitting: Advanced Practice Midwife

## 2018-08-26 DIAGNOSIS — Z3A24 24 weeks gestation of pregnancy: Secondary | ICD-10-CM | POA: Diagnosis not present

## 2018-08-26 DIAGNOSIS — Z3402 Encounter for supervision of normal first pregnancy, second trimester: Secondary | ICD-10-CM | POA: Diagnosis not present

## 2018-08-26 NOTE — Progress Notes (Signed)
I connected with  Jacquelynn Cree on 08/26/18 at  2:35 PM EDT by telephone and verified that I am speaking with the correct person using two identifiers.   I discussed the limitations, risks, security and privacy concerns of performing an evaluation and management service by telephone and the availability of in person appointments. I also discussed with the patient that there may be a patient responsible charge related to this service. The patient expressed understanding and agreed to proceed.  Janene Madeira Sanda Dejoy, CMA 08/26/2018  2:19 PM   Set pt up for Baby Rx/ will call back for verification that email was received.  Explained 28 week visit to patient.

## 2018-08-26 NOTE — Progress Notes (Signed)
   TELEHEALTH VIRTUAL OBSTETRICS VISIT ENCOUNTER NOTE  I connected with Jacquelynn Cree on 08/26/18 at  2:35 PM EDT by telephone at home and verified that I am speaking with the correct person using two identifiers.   I discussed the limitations, risks, security and privacy concerns of performing an evaluation and management service by telephone and the availability of in person appointments. I also discussed with the patient that there may be a patient responsible charge related to this service. The patient expressed understanding and agreed to proceed.  Subjective:  CORNELIOUS JEREMIAH is a 23 y.o. G1P0 at [redacted]w[redacted]d being followed for ongoing prenatal care.  She is currently monitored for the following issues for this low-risk pregnancy and has Supervision of low-risk first pregnancy and Obesity affecting pregnancy in second trimester on their problem list.  Patient reports no complaints. Reports fetal movement. Denies any contractions, bleeding or leaking of fluid.   The following portions of the patient's history were reviewed and updated as appropriate: allergies, current medications, past family history, past medical history, past social history, past surgical history and problem list.   Objective:   General:  Alert, oriented and cooperative.   Mental Status: Normal mood and affect perceived. Normal judgment and thought content.  Rest of physical exam deferred due to type of encounter  Assessment and Plan:  Pregnancy: G1P0 at [redacted]w[redacted]d 1. Encounter for supervision of low-risk first pregnancy in second trimester - [facility] AMB BABYSCRIPTS SCHEDULE OPTIMIZATION - CBC; Future - HIV Antibody (routine testing w rflx); Future - RPR; Future - Glucose Tolerance, 2 Hours w/1 Hour; Future - Patient has [place] appt on 09/23/2018 she would like to have labs and next OB visit on the same day if possible. Message sent to front office staff to coordinate visits. - Patient received email while on phone. Will  set up babyscripts app. Patient will send mychart message if she does not get a blood pressure cuff in the mail within about one week of setting up babyscripts account. She has medicaid, so rx can be sent to her pharmacy if needed.    Preterm labor symptoms and general obstetric precautions including but not limited to vaginal bleeding, contractions, leaking of fluid and fetal movement were reviewed in detail with the patient.  I discussed the assessment and treatment plan with the patient. The patient was provided an opportunity to ask questions and all were answered. The patient agreed with the plan and demonstrated an understanding of the instructions. The patient was advised to call back or seek an in-person office evaluation/go to [facility] at [facility] for any urgent or concerning symptoms. Please refer to After Visit Summary for other counseling recommendations.   I provided 10 minutes of non-face-to-face time during this encounter.  Return in about 4 weeks (around 09/23/2018) for 28 week labs and GTT and in- person visit .  Future Appointments  Date Time Provider Department Center  09/23/2018  3:00 PM WH-MFC [place] 3 WH-MFCUS MFC-US    Thressa Sheller, CNM Center for [employer], [facility]

## 2018-08-27 ENCOUNTER — Encounter: Payer: Self-pay | Admitting: Obstetrics & Gynecology

## 2018-08-27 ENCOUNTER — Encounter: Payer: Self-pay | Admitting: Medical

## 2018-09-04 ENCOUNTER — Telehealth: Payer: Self-pay | Admitting: *Deleted

## 2018-09-04 NOTE — Telephone Encounter (Signed)
Called pt regarding babyscripts.  Per chart review, pt has never logged a blood pressure and the BP cuff was delivered.  Pt stated that she was not currently at home but would take a BP and log it into the app when she did get home.  Pt educated on how to take a BP, including proper position and resting 10-15 minutes prior to taking that BP.  Advised pt to take BP weekly and log it into the app. Requesting pt call clinic if she had any difficulties.  Pt verbalized understanding.

## 2018-09-22 ENCOUNTER — Telehealth: Payer: Self-pay | Admitting: Obstetrics and Gynecology

## 2018-09-22 ENCOUNTER — Other Ambulatory Visit (HOSPITAL_COMMUNITY): Payer: Self-pay | Admitting: *Deleted

## 2018-09-22 DIAGNOSIS — Z362 Encounter for other antenatal screening follow-up: Secondary | ICD-10-CM

## 2018-09-22 NOTE — Telephone Encounter (Signed)
Called the patient to inform of upcoming appointment, new location, fastening, and restrictions due to COVID19.

## 2018-09-23 ENCOUNTER — Other Ambulatory Visit (HOSPITAL_COMMUNITY): Payer: Self-pay | Admitting: *Deleted

## 2018-09-23 ENCOUNTER — Ambulatory Visit (INDEPENDENT_AMBULATORY_CARE_PROVIDER_SITE_OTHER): Payer: Medicaid Other | Admitting: Student

## 2018-09-23 ENCOUNTER — Ambulatory Visit (HOSPITAL_COMMUNITY): Payer: Medicaid Other

## 2018-09-23 ENCOUNTER — Ambulatory Visit (HOSPITAL_COMMUNITY)
Admission: RE | Admit: 2018-09-23 | Discharge: 2018-09-23 | Disposition: A | Discharge status: Home / Self Care | Payer: Medicaid Other | Source: Ambulatory Visit | Attending: Maternal & Fetal Medicine | Admitting: Maternal & Fetal Medicine

## 2018-09-23 ENCOUNTER — Other Ambulatory Visit: Payer: Medicaid Other

## 2018-09-23 ENCOUNTER — Other Ambulatory Visit: Payer: Self-pay

## 2018-09-23 VITALS — BP 119/81 | HR 101 | Temp 98.8°F | Wt 259.1 lb

## 2018-09-23 DIAGNOSIS — Z3A28 28 weeks gestation of pregnancy: Secondary | ICD-10-CM

## 2018-09-23 DIAGNOSIS — Z23 Encounter for immunization: Secondary | ICD-10-CM

## 2018-09-23 DIAGNOSIS — Z3402 Encounter for supervision of normal first pregnancy, second trimester: Secondary | ICD-10-CM

## 2018-09-23 DIAGNOSIS — O99213 Obesity complicating pregnancy, third trimester: Secondary | ICD-10-CM

## 2018-09-23 DIAGNOSIS — Z362 Encounter for other antenatal screening follow-up: Secondary | ICD-10-CM | POA: Insufficient documentation

## 2018-09-23 NOTE — Progress Notes (Signed)
   PRENATAL VISIT NOTE  Subjective:  Madeline Garcia is a 23 y.o. G1P0 at [redacted]w[redacted]d being seen today for ongoing prenatal care.  She is currently monitored for the following issues for this low-risk pregnancy and has Supervision of low-risk first pregnancy and Obesity affecting pregnancy in second trimester on their problem list.  Patient reports no complaints.  Contractions: Not present. Vag. Bleeding: None.  Movement: Present. Denies leaking of fluid.   The following portions of the patient's history were reviewed and updated as appropriate: allergies, current medications, past family history, past medical history, past social history, past surgical history and problem list.   Objective:   Vitals:   09/23/18 0832  BP: 119/81  Pulse: (!) 101  Temp: 98.8 F (37.1 C)  Weight: 259 lb 1.6 oz (117.5 kg)    Fetal Status: Fetal Heart Rate (bpm): 140 Fundal Height: 28 cm Movement: Present     General:  Alert, oriented and cooperative. Patient is in no acute distress.  Skin: Skin is warm and dry. No rash noted.   Cardiovascular: Normal heart rate noted  Respiratory: Normal respiratory effort, no problems with respiration noted  Abdomen: Soft, gravid, appropriate for gestational age.  Pain/Pressure: Absent     Pelvic: Cervical exam deferred        Extremities: Normal range of motion.  Edema: None  Mental Status: Normal mood and affect. Normal behavior. Normal judgment and thought content.   Assessment and Plan:  Pregnancy: G1P0 at [redacted]w[redacted]d 1. Encounter for supervision of low-risk first pregnancy in second trimester - Tdap vaccine greater than or equal to 7yo IM -2 hour gtt in process -[place] follow up this afternoon  -BPs on baby scripts have been normal; patient will check on Friday although she missed last week -She will check on Fridays     Preterm labor symptoms and general obstetric precautions including but not limited to vaginal bleeding, contractions, leaking of fluid and fetal  movement were reviewed in detail with the patient. Please refer to After Visit Summary for other counseling recommendations.   Return in about 4 weeks (around 10/21/2018), or My chart virtual visit.  Future Appointments  Date Time Provider Department Center  09/23/2018  9:30 AM WOC-WOCA LAB WOC-WOCA WOC  09/23/2018  3:00 PM WH-MFC [place] 3 WH-MFCUS MFC-US    Marylene Land, CNM

## 2018-09-23 NOTE — Patient Instructions (Signed)
Glucose Tolerance Test During Pregnancy Why am I having this test? The glucose tolerance test (GTT) is done to check how your body processes sugar (glucose). This is one of several tests used to diagnose diabetes that develops during pregnancy (gestational diabetes mellitus). Gestational diabetes is a temporary form of diabetes that some women develop during pregnancy. It usually occurs during the second trimester of pregnancy and goes away after delivery. Testing (screening) for gestational diabetes usually occurs between 24 and 28 weeks of pregnancy. You may have the GTT test after having a 1-hour glucose screening test if the results from that test indicate that you may have gestational diabetes. You may also have this test if:  You have a history of gestational diabetes.  You have a history of giving birth to very large babies or have experienced repeated fetal loss (stillbirth).  You have signs and symptoms of diabetes, such as: ? Changes in your vision. ? Tingling or numbness in your hands or feet. ? Changes in hunger, thirst, and urination that are not otherwise explained by your pregnancy. What is being tested? This test measures the amount of glucose in your blood at different times during a period of 3 hours. This indicates how well your body is able to process glucose. What kind of sample is taken?  Blood samples are required for this test. They are usually collected by inserting a needle into a blood vessel. How do I prepare for this test?  For 3 days before your test, eat normally. Have plenty of carbohydrate-rich foods.  Follow instructions from your health care provider about: ? Eating or drinking restrictions on the day of the test. You may be asked to not eat or drink anything other than water (fast) starting 8-10 hours before the test. ? Changing or stopping your regular medicines. Some medicines may interfere with this test. Tell a health care provider about:  All  medicines you are taking, including vitamins, herbs, eye drops, creams, and over-the-counter medicines.  Any blood disorders you have.  Any surgeries you have had.  Any medical conditions you have. What happens during the test? First, your blood glucose will be measured. This is referred to as your fasting blood glucose, since you fasted before the test. Then, you will drink a glucose solution that contains a certain amount of glucose. Your blood glucose will be measured again 1, 2, and 3 hours after drinking the solution. This test takes about 3 hours to complete. You will need to stay at the testing location during this time. During the testing period:  Do not eat or drink anything other than the glucose solution.  Do not exercise.  Do not use any products that contain nicotine or tobacco, such as cigarettes and e-cigarettes. If you need help stopping, ask your health care provider. The testing procedure may vary among health care providers and hospitals. How are the results reported? Your results will be reported as milligrams of glucose per deciliter of blood (mg/dL) or millimoles per liter (mmol/L). Your health care provider will compare your results to normal ranges that were established after testing a large group of people (reference ranges). Reference ranges may vary among labs and hospitals. For this test, common reference ranges are:  Fasting: less than 95-105 mg/dL (5.3-5.8 mmol/L).  1 hour after drinking glucose: less than 180-190 mg/dL (10.0-10.5 mmol/L).  2 hours after drinking glucose: less than 155-165 mg/dL (8.6-9.2 mmol/L).  3 hours after drinking glucose: 140-145 mg/dL (7.8-8.1 mmol/L). What do the   results mean? Results within reference ranges are considered normal, meaning that your glucose levels are well-controlled. If two or more of your blood glucose levels are high, you may be diagnosed with gestational diabetes. If only one level is high, your health care  provider may suggest repeat testing or other tests to confirm a diagnosis. Talk with your health care provider about what your results mean. Questions to ask your health care provider Ask your health care provider, or the department that is doing the test:  When will my results be ready?  How will I get my results?  What are my treatment options?  What other tests do I need?  What are my next steps? Summary  The glucose tolerance test (GTT) is one of several tests used to diagnose diabetes that develops during pregnancy (gestational diabetes mellitus). Gestational diabetes is a temporary form of diabetes that some women develop during pregnancy.  You may have the GTT test after having a 1-hour glucose screening test if the results from that test indicate that you may have gestational diabetes. You may also have this test if you have any symptoms or risk factors for gestational diabetes.  Talk with your health care provider about what your results mean. This information is not intended to replace advice given to you by your health care provider. Make sure you discuss any questions you have with your health care provider. Document Released: 10/23/2011 Document Revised: 12/03/2016 Document Reviewed: 12/03/2016 Elsevier Interactive Patient Education  2019 Elsevier Inc.  

## 2018-09-24 ENCOUNTER — Other Ambulatory Visit: Payer: Self-pay | Admitting: Student

## 2018-09-24 DIAGNOSIS — O99013 Anemia complicating pregnancy, third trimester: Secondary | ICD-10-CM | POA: Insufficient documentation

## 2018-09-24 LAB — CBC
Hematocrit: 31.2 % — ABNORMAL LOW (ref 34.0–46.6)
Hemoglobin: 10.4 g/dL — ABNORMAL LOW (ref 11.1–15.9)
MCH: 26.6 pg (ref 26.6–33.0)
MCHC: 33.3 g/dL (ref 31.5–35.7)
MCV: 80 fL (ref 79–97)
Platelets: 326 10*3/uL (ref 150–450)
RBC: 3.91 x10E6/uL (ref 3.77–5.28)
RDW: 12.8 % (ref 11.7–15.4)
WBC: 8.6 10*3/uL (ref 3.4–10.8)

## 2018-09-24 LAB — GLUCOSE TOLERANCE, 2 HOURS W/ 1HR
Glucose, 1 hour: 134 mg/dL (ref 65–179)
Glucose, 2 hour: 118 mg/dL (ref 65–152)
Glucose, Fasting: 81 mg/dL (ref 65–91)

## 2018-09-24 LAB — RPR: RPR Ser Ql: NONREACTIVE

## 2018-09-24 LAB — HIV ANTIBODY (ROUTINE TESTING W REFLEX): HIV Screen 4th Generation wRfx: NONREACTIVE

## 2018-09-24 MED ORDER — FERROUS SULFATE 325 (65 FE) MG PO TABS: 325.0000 mg | ORAL_TABLET | Freq: Every day | ORAL | 4 refills | Status: DC

## 2018-09-24 MED ORDER — DOCUSATE SODIUM 100 MG PO CAPS: 100.0000 mg | ORAL_CAPSULE | Freq: Two times a day (BID) | ORAL | 0 refills | Status: DC

## 2018-09-24 MED ORDER — DOCUSATE SODIUM 100 MG PO CAPS: 100.0000 mg | ORAL_CAPSULE | Freq: Two times a day (BID) | ORAL | 4 refills | Status: DC

## 2018-10-05 ENCOUNTER — Encounter

## 2018-10-20 ENCOUNTER — Telehealth: Payer: Self-pay | Admitting: Advanced Practice Midwife

## 2018-10-20 NOTE — Telephone Encounter (Signed)
Called the patient to inform of the mychart visit. Also explained to access the mychart. The patient also confirmed the appointment with ultrasound. Informed the patient she can arrive to ultrasound early and complete the visit in the car as her appointment is right after. The patient verbalized understanding.

## 2018-10-21 ENCOUNTER — Ambulatory Visit (HOSPITAL_COMMUNITY)
Admission: RE | Admit: 2018-10-21 | Discharge: 2018-10-21 | Disposition: A | Discharge status: Home / Self Care | Payer: Medicaid Other | Source: Ambulatory Visit | Attending: Obstetrics and Gynecology | Admitting: Obstetrics and Gynecology

## 2018-10-21 ENCOUNTER — Other Ambulatory Visit (HOSPITAL_COMMUNITY): Payer: Self-pay | Admitting: *Deleted

## 2018-10-21 ENCOUNTER — Telehealth: Payer: Self-pay

## 2018-10-21 ENCOUNTER — Other Ambulatory Visit: Payer: Self-pay

## 2018-10-21 ENCOUNTER — Telehealth: Payer: Medicaid Other

## 2018-10-21 DIAGNOSIS — Z91199 Patient's noncompliance with other medical treatment and regimen due to unspecified reason: Secondary | ICD-10-CM

## 2018-10-21 DIAGNOSIS — Z3A32 32 weeks gestation of pregnancy: Secondary | ICD-10-CM | POA: Diagnosis not present

## 2018-10-21 DIAGNOSIS — O99213 Obesity complicating pregnancy, third trimester: Secondary | ICD-10-CM | POA: Diagnosis not present

## 2018-10-21 DIAGNOSIS — Z362 Encounter for other antenatal screening follow-up: Secondary | ICD-10-CM

## 2018-10-21 DIAGNOSIS — Z5329 Procedure and treatment not carried out because of patient's decision for other reasons: Secondary | ICD-10-CM

## 2018-10-21 DIAGNOSIS — Z6841 Body Mass Index (BMI) 40.0 and over, adult: Secondary | ICD-10-CM

## 2018-10-21 NOTE — Progress Notes (Signed)
I connected with  [name] on 10/21/18 at  2:15 PM EDT by telephone and verified that I am speaking with the correct person using two identifiers.   I discussed the limitations, risks, security and privacy concerns of performing an evaluation and management service by telephone and the availability of in person appointments. I also discussed with the patient that there may be a patient responsible charge related to this service. The patient expressed understanding and agreed to proceed.  [place], [place] 10/21/2018  2:19 PM

## 2018-10-21 NOTE — Telephone Encounter (Signed)
The patient stated she could not access the mychart visit. She kept getting a pop up blocked message. Informed the patient of the contact help desk.

## 2018-10-21 NOTE — Progress Notes (Signed)
Unable to reach patient to complete virtual visit. Will reschedule.  Wende Mott, CNM 10/21/18 2:40 PM

## 2018-10-22 ENCOUNTER — Telehealth (INDEPENDENT_AMBULATORY_CARE_PROVIDER_SITE_OTHER): Payer: Medicaid Other | Admitting: Medical

## 2018-10-22 ENCOUNTER — Encounter: Payer: Self-pay | Admitting: Medical

## 2018-10-22 VITALS — BP 129/82 | HR 105

## 2018-10-22 DIAGNOSIS — O99212 Obesity complicating pregnancy, second trimester: Secondary | ICD-10-CM

## 2018-10-22 DIAGNOSIS — O99013 Anemia complicating pregnancy, third trimester: Secondary | ICD-10-CM

## 2018-10-22 DIAGNOSIS — O99213 Obesity complicating pregnancy, third trimester: Secondary | ICD-10-CM

## 2018-10-22 DIAGNOSIS — Z3A32 32 weeks gestation of pregnancy: Secondary | ICD-10-CM

## 2018-10-22 DIAGNOSIS — Z3403 Encounter for supervision of normal first pregnancy, third trimester: Secondary | ICD-10-CM

## 2018-10-22 NOTE — Patient Instructions (Addendum)
Fetal Movement Counts °Patient Name: ________________________________________________ Patient Due Date: ____________________ °What is a fetal movement count? ° °A fetal movement count is the number of times that you feel your baby move during a certain amount of time. This may also be called a fetal kick count. A fetal movement count is recommended for every pregnant woman. You may be asked to start counting fetal movements as early as week 28 of your pregnancy. °Pay attention to when your baby is most active. You may notice your baby's sleep and wake cycles. You may also notice things that make your baby move more. You should do a fetal movement count: °· When your baby is normally most active. °· At the same time each day. °A good time to count movements is while you are resting, after having something to eat and drink. °How do I count fetal movements? °1. Find a quiet, comfortable area. Sit, or lie down on your side. °2. Write down the date, the start time and stop time, and the number of movements that you felt between those two times. Take this information with you to your health care visits. °3. For 2 hours, count kicks, flutters, swishes, rolls, and jabs. You should feel at least 10 movements during 2 hours. °4. You may stop counting after you have felt 10 movements. °5. If you do not feel 10 movements in 2 hours, have something to eat and drink. Then, keep resting and counting for 1 hour. If you feel at least 4 movements during that hour, you may stop counting. °Contact a health care provider if: °· You feel fewer than 4 movements in 2 hours. °· Your baby is not moving like he or she usually does. °Date: ____________ Start time: ____________ Stop time: ____________ Movements: ____________ °Date: ____________ Start time: ____________ Stop time: ____________ Movements: ____________ °Date: ____________ Start time: ____________ Stop time: ____________ Movements: ____________ °Date: ____________ Start time:  ____________ Stop time: ____________ Movements: ____________ °Date: ____________ Start time: ____________ Stop time: ____________ Movements: ____________ °Date: ____________ Start time: ____________ Stop time: ____________ Movements: ____________ °Date: ____________ Start time: ____________ Stop time: ____________ Movements: ____________ °Date: ____________ Start time: ____________ Stop time: ____________ Movements: ____________ °Date: ____________ Start time: ____________ Stop time: ____________ Movements: ____________ °This information is not intended to replace advice given to you by your health care provider. Make sure you discuss any questions you have with your health care provider. °Document Released: 05/23/2006 Document Revised: 12/21/2015 Document Reviewed: 06/02/2015 °Elsevier Interactive Patient Education © 2019 Elsevier Inc. °Braxton Hicks Contractions °Contractions of the uterus can occur throughout pregnancy, but they are not always a sign that you are in labor. You may have practice contractions called Braxton Hicks contractions. These false labor contractions are sometimes confused with true labor. °What are Braxton Hicks contractions? °Braxton Hicks contractions are tightening movements that occur in the muscles of the uterus before labor. Unlike true labor contractions, these contractions do not result in opening (dilation) and thinning of the cervix. Toward the end of pregnancy (32-34 weeks), Braxton Hicks contractions can happen more often and may become stronger. These contractions are sometimes difficult to tell apart from true labor because they can be very uncomfortable. You should not feel embarrassed if you go to the hospital with false labor. °Sometimes, the only way to tell if you are in true labor is for your health care provider to look for changes in the cervix. The health care provider will do a physical exam and may monitor your contractions. If   you are not in true labor, the exam  should show that your cervix is not dilating and your water has not broken. °If there are no other health problems associated with your pregnancy, it is completely safe for you to be sent home with false labor. You may continue to have Braxton Hicks contractions until you go into true labor. °How to tell the difference between true labor and false labor °True labor °· Contractions last 30-70 seconds. °· Contractions become very regular. °· Discomfort is usually felt in the top of the uterus, and it spreads to the lower abdomen and low back. °· Contractions do not go away with walking. °· Contractions usually become more intense and increase in frequency. °· The cervix dilates and gets thinner. °False labor °· Contractions are usually shorter and not as strong as true labor contractions. °· Contractions are usually irregular. °· Contractions are often felt in the front of the lower abdomen and in the groin. °· Contractions may go away when you walk around or change positions while lying down. °· Contractions get weaker and are shorter-lasting as time goes on. °· The cervix usually does not dilate or become thin. °Follow these instructions at home: ° °· Take over-the-counter and prescription medicines only as told by your health care provider. °· Keep up with your usual exercises and follow other instructions from your health care provider. °· Eat and drink lightly if you think you are going into labor. °· If Braxton Hicks contractions are making you uncomfortable: °? Change your position from lying down or resting to walking, or change from walking to resting. °? Sit and rest in a tub of warm water. °? Drink enough fluid to keep your urine pale yellow. Dehydration may cause these contractions. °? Do slow and deep breathing several times an hour. °· Keep all follow-up prenatal visits as told by your health care provider. This is important. °Contact a health care provider if: °· You have a fever. °· You have continuous  pain in your abdomen. °Get help right away if: °· Your contractions become stronger, more regular, and closer together. °· You have fluid leaking or gushing from your vagina. °· You pass blood-tinged mucus (bloody show). °· You have bleeding from your vagina. °· You have low back pain that you never had before. °· You feel your baby’s head pushing down and causing pelvic pressure. °· Your baby is not moving inside you as much as it used to. °Summary °· Contractions that occur before labor are called Braxton Hicks contractions, false labor, or practice contractions. °· Braxton Hicks contractions are usually shorter, weaker, farther apart, and less regular than true labor contractions. True labor contractions usually become progressively stronger and regular, and they become more frequent. °· Manage discomfort from Braxton Hicks contractions by changing position, resting in a warm bath, drinking plenty of water, or practicing deep breathing. °This information is not intended to replace advice given to you by your health care provider. Make sure you discuss any questions you have with your health care provider. °Document Released: 09/06/2016 Document Revised: 02/05/2017 Document Reviewed: 09/06/2016 °Elsevier Interactive Patient Education © 2019 Elsevier Inc. ° °AREA PEDIATRIC/FAMILY PRACTICE PHYSICIANS ° °[place] ([address]) °• [facility] °o Chambliss, MD; Eniola, MD; Hale, MD; Hensel, MD; McDiarmid, MD; McIntyer, MD; Neal, MD; Walden, MD °o [address] [place], [place] [address] °o [phone] °o Mon-Fri 8:30-12:30, 1:30-5:00 °o Providers come to see babies at [facility] °o Accepting Medicaid °•   [facility] °o Limited providers who accept newborns: Koirala, MD; Morrow, MD; Wolters, MD °o [address], [place], [place] [address] °o [phone] °o Mon-Fri 8:00-5:30 °o Babies seen by providers at [facility] °o Does NOT  accept Medicaid °o Please call early in hospitalization for appointment (limited availability)  °• [facility] °o Mulberry, MD °o [address] [place], [place] [address] °o [phone] °o Mon, Tue, Thur, Fri 8:30-5:00, Wed 10:00-7:00 (closed 1-2pm) °o Babies seen by [facility] providers °o Accepting Medicaid °• [facility] °o Rubin, MD °o [address], [place], [place] [address] °o [phone] °o Mon-Fri 8:30-5:00, Sat 8:30-12:00 °o Provider comes to see babies at [facility] °o Accepting Medicaid °o Must have been referred from current patients or contacted office prior to delivery °• [facility] ([facility]) °o Brown, MD; Chandler, MD; Ettefagh, MD; [place], MD; Lester, MD; McCormick, MD; McQueen, MD; Prose, MD; Simha, MD; Stanley, MD; Stryffeler, NP; Tebben, NP °o [address], [place], [place] [address] °o [phone] °o Mon, Tue, Thur, Fri 8:30-5:30, Wed 9:30-5:30, Sat 8:30-12:30 °o Babies seen by [facility] providers °o Accepting Medicaid °o Only accepting infants of first-time parents or siblings of current patients °o Hospital discharge coordinator will make follow-up appointment °• Jack Amos °o [address], [place], [place]  [address] °o [phone]   Fax - [phone] °• [facility] °o [address], [place], [place]  [address] °o Phone - [phone]   Fax - [phone] °• Shilpa Gosrani °o [address], [place], [place]  [address] °o [phone] ° °[place] ([address]) °• [facility] °o Bates, MD; Brassfield, MD; Cooper, Cox, MD; MD; Davis, MD; Dovico, MD; Ettefaugh, MD; Little, MD; Lowe, MD; Keiffer, MD; Melvin, MD; Sumner, MD; Williams, MD °o [address], [place], [place] [address] °o [phone] °o Mon-Fri 8:30-5:00 (extended evenings Mon-Thur as needed), Sat-Sun 10:00-1:00 °o Providers come to see babies at  [facility] °o Accepting Medicaid for families of first-time babies and families with all children in the household age 3 and under. Must register with office prior to making appointment (M-F only). °• [facility] °o Henson, NP; Knapp, MD; Lalonde, MD; Tysinger, PA °o [address] [place], [place] [address] °o [phone] °o Mon-Fri 8:00-5:00 °o Babies seen by providers at [facility] °o Does NOT accept Medicaid/Commercial Insurance Only °• [facility] [place] ([facility])  °o Artis, MD; Barnes, MD; Bratton, MD; Coccaro, MD; Lockett Gardner, MD; Kramer, MD; Marshall, MD; Netherton, MD; Poleto, MD; Skinner, MD °o [address] [place], [place] [address] °o [phone] °o Mon-Fri 8:30-5:30, Sat (Oct.-Mar.) 9:00-1:00 °o Babies seen by providers at [facility] °o Accepting Medicaid ° °[place] ([address]) °• [facility] °o Reid, MD; Warner, MD °o [address], [place], [place] [address] °o [phone] °o Mon-Fri 8:30-5:00, Sat 8:30-12:00 °o Providers come to see babies at [facility] °o Does NOT accept Medicaid °• [facility] °o Becker, PA; Hagler, MD; Scifres, PA; Sun, MD; Swayne, MD °o [address], [place], [place] [address] °o [phone] °o Mon-Fri 8:00-5:00 °o Babies seen by providers at [facility] °o Does NOT accept Medicaid °o Only accepting babies of parents who are patients °o Please call early in hospitalization for appointment (limited availability) °•   [facility] °o Clark, MD; Frye, MD; Kelleher, MD; Mack, NP; Miller, MD; O'Keller, MD; Patterson, NP; Pudlo, MD; Puzio, MD; Thomas, MD; Tucker, MD; Twiselton, MD °o [address], [place], [place] [address] °o [phone] °o Mon-Fri 8:00-5:00, Sat 9:00-12:00 °o Providers come to see babies at [facility] °o Does NOT accept Medicaid ° °[place] ([address]) °• [facility] °o Limited providers accepting new patients: Brake, NP; Wharton, PA °o [address], [place], [place] [address] °o [phone] °o Mon-Fri 8:00-5:00 °o Babies seen by providers at [facility] °o Does NOT accept Medicaid °o Only accepting babies of parents who are patients °o Please call early in hospitalization for appointment (limited availability) °• [facility] °o Gay, MD; Quinlan, MD °o [address] [place], [place] [address] °o [phone] (press 1 to schedule appointment) °o Mon-Fri 8:00-5:00 °o Providers come to see babies at [facility] °o Does NOT accept Medicaid °• [facility] °o Mazer, MD °o [address] [place], [place] [address] °o [phone] °o Mon-Fri 8:30-5:00 (lunch 12:30-1:00), extended hours by appointment only Wed 5:00-6:30 °o Babies seen by [facility] providers °o Accepting Medicaid °• [facility] °o Banks, MD; Jordan, MD; Koberlein, MD °o [address], [place], [place] [address] °o [phone] °o Mon-Fri 8:00-5:00 °o Babies seen by [facility] providers °o Does NOT accept Medicaid °• [facility] °o Parker, MD; Hunter, MD; Wallace, DO °o [address] [place], [place] [address] °o [phone] °o Mon-Fri 8:00-5:00 °o Babies seen by [facility] providers °o Does NOT accept Medicaid °• [facility] °o Brandon, PA; Brecken, PA; Christy, NP; Dees, MD; DeClaire, MD; DeWeese, MD; Hansen, NP; Mills, NP; Parrish, NP; Smoot, NP; Summer, MD; Vapne, MD °o [address] [place], [place] [address] °o [phone] °o Mon-Fri 8:30-5:00, Sat 10:00-1:00 °o Providers come to see babies at [facility] °o Does NOT accept Medicaid °o Free prenatal information session Tuesdays at 4:45pm °• [facility] °o Bouska, MD; Gordon, PA; Jeffery, PA; Weber, PA °o [address] [place]  [address] °o [phone] °o Mon-Fri 7:30-5:30 °o Babies seen by [facility] providers °• [facility] °o [address], [place], [place]  [address] °o [phone]   Fax - [phone] ° °[place] ([address]) °• [facility] °o Reese, MD °o [address] [place], [place] [address] °o [phone] °o Mon-Thur 8:00-6:00 °o Providers come to see babies at [facility] °o Accepting Medicaid °• [facility] °o Anderson, NP; Badger, MD; Beal, PA; Spencer, PA °o [address] [place], [place] [address] °o [phone] °o Mon-Thur 7:30-7:30, Fri 7:30-4:30 °o Babies seen by [facility] providers °o Accepting Medicaid °• [facility] °o Agbuya, MD; Klett, NP; Romgoolam, MD °o [address], [place], [place] [address] °o [phone] °o Mon-Fri 8:30-5:00, Sat 8:30-12:00 °o Providers come to see babies at [facility] °o Accepting Medicaid °o Must have “Meet & Greet” appointment at office prior to delivery °• [facility] ([facility] °o McCord, MD; Wallace, MD; Wood, MD °o [address], ,  [address] °o [phone] °o Mon-Wed 8:00-6:00, Thur-Fri 8:00-5:00, Sat 9:00-12:00 °o Providers come to see babies at [facility] °o Does NOT accept Medicaid °o Only accepting siblings of current patients °• [facility]   °o [address],   [place], [place]  [address] °o [phone]   Fax - [phone] °• [facility] °o [address], [place], [place]  [address] °o [phone]   Fax - [phone] ° °[place] ([address]) °• [facility] °o Cirigliano, DO; Matthews, DO °o [address] [place], [place] [address] °o [phone] °o Mon-Fri 7:00-5:00 °o Babies seen by [facility] providers °o Does NOT accept  Medicaid °• [facility] °o Briscoe, MD; Howley, PA; Moreira, PA °o [address], [place], [place] [address] °o [phone] °o Mon-Fri 8:00-5:00 °o Babies seen by [facility] providers °o Accepting Medicaid °• [facility] °o Boyd, MD; Church, PA; Jones, NP; Osborn, PA °o [address], [place], [place] [address] °o [phone] °o Mon-Fri 8:00-5:00 °o Babies seen by providers at [facility] °o Accepting Medicaid ° °[place] ([address]) °• [facility] [place] °o [place], DO °o [address] [place], [place] [address] °o [phone] °o Mon-Fri 8:00-5:00 °o Babies seen by [facility] providers °o Does NOT accept Medicaid °o Limited availability, please call early in hospitalization to schedule follow-up °• [facility] °o Calderon, PA; Cummings, MD; Dillard, MD; Martin, PA; Olson, MD; VanDeven, PA °o [address], [place], [place] [address] °o [phone] °o Mon-Fri 8:30-5:00, Sat 9:00-12:00 °o Babies seen by providers at [facility] °o Accepting Medicaid °o Please register online then schedule online or call office °o [email or web address] °• [facility] ([facility] °o Hunter, NP; Kumar, MD; Martin Rogers, PA °o [address], [place], [place] [address] °o [phone] °o Mon-Fri 8:00-5:00 °o Babies seen by providers at [facility] °o Accepting Medicaid °• [facility] ([facility]) °o [place], MD; Kristi Fleenor, NP; West, MD °o [address], [place], [place] [address] °o [phone] °o Mon-Fri 8:00-5:30, Sat&Sun by appointment (phones open at 8:30) °o Babies seen by [facility] providers °o Accepting Medicaid °o Must be a first-time baby or sibling of current patient °• [facility] [place]  °o [address], [place], [place]  [address] °o [phone]   Fax - [phone] ° °[place] ([address]) °• [facility] [place] [facility] °o Brown, PA; Cowen, PA; Rice, MD; Helton, PA; Spry, MD °o [address] [place], [place] [address] °o [phone] °o Mon-Thur 8:00-7:00, Fri 8:00-5:00, Sat 8:00-12:00, Sun 9:00-12:00 °o Babies seen by [facility] providers °o Accepting Medicaid °• [facility] [place] °o Coe-Goins, MD; Marshall, MD; Pierre-Louis, MD °o [address], [place], [place] [address] °o [phone] °o Mon-Thur 8:00-5:00 °o Babies seen by providers at [facility] °o Accepting Medicaid °• [facility] °o Bratton, MD; Coe-Goins, MD; Hayes, MD; Lewis, MD; List, MD; Lott, MD; Marshall, MD; Moran, MD; O'Neal, MD; Pierre-Louis, MD; Pitonzo, MD; Scholer, MD; Spangle, MD °o [address] [place], [place] [address] °o [phone] °o Mon-Fri 8:00-5:30, Sat (Oct.-Mar.) 9:00-1:00 °o Babies seen by providers at [facility] °o Accepting Medicaid °o Must fill out new patient packet, available online at [email or web address] °• [facility] ([facility]) °o Friddle, NP; Harris, NP; Kelly, NP; Logan, MD; Melvin, PA; Poth, MD; Ramadoss, MD;   Stanton, NP °o [address], [place], [place] [address] °o [phone] °o Mon-Thur 8:00-5:30, Fri 8:00-5:00 °o Babies seen by providers at [facility] °o Accepting Medicaid ° °[place] ([address]) °• [facility] °o Dixon, PA; Moxee, MD; Pickard, MD; Tapia, PA °o [address], [place], [place] [address] °o [phone] °o Mon-Fri 8:00-5:00 °o Babies seen by providers at [facility] °o Accepting Medicaid  ° °[place] ([address]) °• [facility] [place] °o Masneri, DO; Meyers, MD; Nelson, PA °o [address], [place], [place]  [address] °o [phone] °o Mon-Fri 8:00-5:00 °o Babies seen by providers at [facility] °o Does NOT accept Medicaid °o Limited appointment availability, please call early in hospitalization ° °• [facility] [place] °o Kunedd, DO; McGowen, MD °o [address], [place], [place] [address] °o [phone] °o Mon-Fri 8:00-5:00 °o Babies seen by [facility] providers °o Does NOT accept Medicaid °• [facility] [place] °o Cameron, MD; MacDonald, MD; Michaels, PA; Nayak, MD °o [address] [place] [address], [place], [place] [address] °o [phone] °o Mon-Fri 8:00-5:00 °o After hours clinic ([address] [place], [place] [address]) [phone] Mon-Fri 5:00-8:00, Sat 12:00-6:00, Sun 10:00-4:00 °o Babies seen by [facility] providers °o Accepting Medicaid °• [facility] [place] °o [address], [place], [place]  [address] °o [phone]   Fax - [phone] ° °[place] ([address]) °• [facility] [place] °o Andy, MD °o [address], [place],  [address] °o [phone] °o Mon-Fri 8:00-5:00 °o Babies seen by [facility] providers °o Does NOT accept Medicaid °• [facility] [place] ([facility] [place]) °o Eksir, MD °o [address], [place],  [address] °o [phone] °o Mon-Thur 8:00-7:00, Fri 8:00-5:00, Sat 8:00-12:00 °o Babies seen by providers at [facility] °o Accepting Medicaid - but does not have vaccinations in office (must be received elsewhere) °o Limited availability, please call early in hospitalization ° °[place] ([address]) °•  Pediatrics  °o Charlene Flemming, MD °o [address],   [address] °o [phone]  Fax [phone] ° ° °

## 2018-10-22 NOTE — Progress Notes (Signed)
  I connected with Madeline Garcia on 10/22/18 at  2:35 PM EDT by: MyChart and verified that I am speaking with the correct person using two identifiers.  Patient is located at home and provider is located at [facility]     The purpose of this virtual visit is to provide medical care while limiting exposure to the novel coronavirus. I discussed the limitations, risks, security and privacy concerns of performing an evaluation and management service by MyChart and the availability of in person appointments. I also discussed with the patient that there may be a patient responsible charge related to this service. By engaging in this virtual visit, you consent to the provision of healthcare.  Additionally, you authorize for your insurance to be billed for the services provided during this visit.  The patient expressed understanding and agreed to proceed.  The following staff members participated in the virtual visit:  Carver Fila, CMA    PRENATAL VISIT NOTE  Subjective:  Madeline Garcia is a 23 y.o. G1P0 at [redacted]w[redacted]d  for phone visit for ongoing prenatal care.  She is currently monitored for the following issues for this low-risk pregnancy and has Supervision of low-risk first pregnancy; Obesity affecting pregnancy in second trimester; and Anemia affecting pregnancy in third trimester on their problem list.  Patient reports no complaints.  Contractions: Not present. Vag. Bleeding: None.  Movement: Present. Denies leaking of fluid.  Patient denies HA, blurred vision, floaters, or edema. No history of elevated BP in this pregnancy   The following portions of the patient's history were reviewed and updated as appropriate: allergies, current medications, past family history, past medical history, past social history, past surgical history and problem list.   Objective:   Vitals:   10/22/18 1440  BP: 129/82  Pulse: (!) 105   Self-Obtained  Fetal Status:     Movement: Present     Assessment and Plan:   Pregnancy: G1P0 at [redacted]w[redacted]d 1. Obesity affecting pregnancy in second trimester - Patient does not have access to a scale - Growth [place] yesterday was normal, scheduled for repeat in 4 weeks   2. Encounter for supervision of low-risk first pregnancy in third trimester - Doing well, no complaints   3. Anemia affecting pregnancy in third trimester  Preterm labor symptoms and general obstetric precautions including but not limited to vaginal bleeding, contractions, leaking of fluid and fetal movement were reviewed in detail with the patient.  Return in about 2 weeks (around 11/05/2018) for LOB, Virtual.  Future Appointments  Date Time Provider [place]  11/18/2018  2:00 PM WH-MFC [place] 3 WH-MFCUS MFC-US     Time spent on virtual visit: 10 minutes  Kerry Hough, PA-C

## 2018-11-05 ENCOUNTER — Telehealth: Payer: Medicaid Other | Admitting: Student

## 2018-11-10 ENCOUNTER — Telehealth: Payer: Medicaid Other | Admitting: Advanced Practice Midwife

## 2018-11-10 ENCOUNTER — Telehealth: Payer: Self-pay

## 2018-11-10 NOTE — Telephone Encounter (Signed)
Attempted to call patient about her appointment on 7/7/ @ 2:55. No answer, left voicemail with appointment information and instructing patient that it is a MyChart visit. Patient instructed to download mychart app and to make sure she can access the appointment.

## 2018-11-11 ENCOUNTER — Telehealth (INDEPENDENT_AMBULATORY_CARE_PROVIDER_SITE_OTHER): Payer: Medicaid Other

## 2018-11-11 ENCOUNTER — Other Ambulatory Visit: Payer: Self-pay

## 2018-11-11 VITALS — BP 114/79 | HR 109

## 2018-11-11 DIAGNOSIS — Z3403 Encounter for supervision of normal first pregnancy, third trimester: Secondary | ICD-10-CM

## 2018-11-11 DIAGNOSIS — O99213 Obesity complicating pregnancy, third trimester: Secondary | ICD-10-CM

## 2018-11-11 DIAGNOSIS — Z3A35 35 weeks gestation of pregnancy: Secondary | ICD-10-CM

## 2018-11-11 DIAGNOSIS — O99212 Obesity complicating pregnancy, second trimester: Secondary | ICD-10-CM

## 2018-11-11 NOTE — Progress Notes (Signed)
I connected with  Madeline Garcia on 11/11/18 at  2:55 PM EDT by telephone and verified that I am speaking with the correct person using two identifiers.   I discussed the limitations, risks, security and privacy concerns of performing an evaluation and management service by telephone and the availability of in person appointments. I also discussed with the patient that there may be a patient responsible charge related to this service. The patient expressed understanding and agreed to proceed.  [place], [place] 11/11/2018  2:50 PM

## 2018-11-11 NOTE — Progress Notes (Signed)
   TELEHEALTH VIRTUAL OBSTETRICS VISIT ENCOUNTER NOTE  I connected with Madeline Garcia on 11/11/18 at  2:55 PM EDT by MyChart Virtual Visit at home and verified that I am speaking with the correct person using two identifiers.   I discussed the limitations, risks, security and privacy concerns of performing an evaluation and management service by telephone and the availability of in person appointments. I also discussed with the patient that there may be a patient responsible charge related to this service. The patient expressed understanding and agreed to proceed.  Subjective:  Madeline Garcia is a 23 y.o. G1P0 at [redacted]w[redacted]d being followed for ongoing prenatal care.  She is currently monitored for the following issues for this low-risk pregnancy and has Supervision of low-risk first pregnancy; Obesity affecting pregnancy in second trimester; and Anemia affecting pregnancy in third trimester on their problem list.  Patient reports no complaints. Reports fetal movement. Denies any contractions, bleeding or leaking of fluid.   The following portions of the patient's history were reviewed and updated as appropriate: allergies, current medications, past family history, past medical history, past social history, past surgical history and problem list.   Objective:   General:  Alert, oriented and cooperative.   Mental Status: Normal mood and affect perceived. Normal judgment and thought content.  Rest of physical exam deferred due to type of encounter  Assessment and Plan:  Pregnancy: G1P0 at [redacted]w[redacted]d 1. Encounter for supervision of low-risk first pregnancy in third trimester -BP 114/79 -Patient with many common discomforts of pregnancy. Reassured of normalcy and reviewed warning signs -Reviewed next visit including GBS testing at length -Counseled patient on routine COVID-19 testing for all admission to inpatient units whether direct admit or from MAU/ED. Reviewed reasons for testing  including identifying cases and protecting patients/staff from possible infection. Reassured patient that separation from newborn is not required for COVID+ tests in asymptomatic patients and that the plan of care will be created with Peds through shared decision-making. One support person is allowed for all admitted patients regardless of COVID test results. All patient questions answered.    2. Obesity affecting pregnancy in second trimester -Follow up u/s scheduled 7/14  Preterm labor symptoms and general obstetric precautions including but not limited to vaginal bleeding, contractions, leaking of fluid and fetal movement were reviewed in detail with the patient.  I discussed the assessment and treatment plan with the patient. The patient was provided an opportunity to ask questions and all were answered. The patient agreed with the plan and demonstrated an understanding of the instructions. The patient was advised to call back or seek an in-person office evaluation/go to MAU at [facility] for any urgent or concerning symptoms. Please refer to After Visit Summary for other counseling recommendations.   I provided 15 minutes of non-face-to-face time during this encounter.  Return in about 1 week (around 11/18/2018) for Return OB visit/GBS.  Future Appointments  Date Time Provider [place]  11/18/2018  2:00 PM WH-MFC [place] 3 WH-MFCUS MFC-US  11/18/2018  3:35 PM Rasch, Artist Pais, NP [place], [place] for [employer], [place]

## 2018-11-18 ENCOUNTER — Ambulatory Visit (INDEPENDENT_AMBULATORY_CARE_PROVIDER_SITE_OTHER): Payer: Medicaid Other | Admitting: Obstetrics and Gynecology

## 2018-11-18 ENCOUNTER — Ambulatory Visit (HOSPITAL_COMMUNITY)
Admission: RE | Admit: 2018-11-18 | Discharge: 2018-11-18 | Disposition: A | Discharge status: Home / Self Care | Payer: Medicaid Other | Source: Ambulatory Visit | Attending: Obstetrics and Gynecology | Admitting: Obstetrics and Gynecology

## 2018-11-18 ENCOUNTER — Other Ambulatory Visit (HOSPITAL_COMMUNITY)
Admission: RE | Admit: 2018-11-18 | Discharge: 2018-11-18 | Disposition: A | Discharge status: Home / Self Care | Payer: Medicaid Other | Source: Ambulatory Visit | Attending: Obstetrics and Gynecology | Admitting: Obstetrics and Gynecology

## 2018-11-18 ENCOUNTER — Other Ambulatory Visit: Payer: Self-pay

## 2018-11-18 VITALS — BP 123/78 | HR 108 | Temp 98.3°F | Wt 265.1 lb

## 2018-11-18 DIAGNOSIS — Z362 Encounter for other antenatal screening follow-up: Secondary | ICD-10-CM

## 2018-11-18 DIAGNOSIS — Z6841 Body Mass Index (BMI) 40.0 and over, adult: Secondary | ICD-10-CM | POA: Diagnosis not present

## 2018-11-18 DIAGNOSIS — Z3A36 36 weeks gestation of pregnancy: Secondary | ICD-10-CM

## 2018-11-18 DIAGNOSIS — Z3403 Encounter for supervision of normal first pregnancy, third trimester: Secondary | ICD-10-CM | POA: Diagnosis present

## 2018-11-18 DIAGNOSIS — O99213 Obesity complicating pregnancy, third trimester: Secondary | ICD-10-CM

## 2018-11-18 LAB — OB RESULTS CONSOLE GBS: GBS: POSITIVE

## 2018-11-18 NOTE — Progress Notes (Signed)
   PRENATAL VISIT NOTE  Subjective:  Madeline Garcia is a 23 y.o. G1P0 at [redacted]w[redacted]d being seen today for ongoing prenatal care.  She is currently monitored for the following issues for this low-risk pregnancy and has Supervision of low-risk first pregnancy; Obesity affecting pregnancy in second trimester; and Anemia affecting pregnancy in third trimester on their problem list.  Patient reports no complaints.  Contractions: Not present. Vag. Bleeding: None.  Movement: Present. Denies leaking of fluid.   The following portions of the patient's history were reviewed and updated as appropriate: allergies, current medications, past family history, past medical history, past social history, past surgical history and problem list.   Objective:   Vitals:   11/18/18 1511  BP: 123/78  Pulse: (!) 108  Temp: 98.3 F (36.8 C)  Weight: 265 lb 1.6 oz (120.2 kg)    Fetal Status: Fetal Heart Rate (bpm): 134 Fundal Height: 37 cm Movement: Present  Presentation: Undeterminable  General:  Alert, oriented and cooperative. Patient is in no acute distress.  Skin: Skin is warm and dry. No rash noted.   Cardiovascular: Normal heart rate noted  Respiratory: Normal respiratory effort, no problems with respiration noted  Abdomen: Soft, gravid, appropriate for gestational age.  Pain/Pressure: Absent     Pelvic: Cervical exam performed Dilation: Fingertip Effacement (%): Thick    Extremities: Normal range of motion.  Edema: None  Mental Status: Normal mood and affect. Normal behavior. Normal judgment and thought content.   Assessment and Plan:  Pregnancy: G1P0 at [redacted]w[redacted]d 1. Encounter for supervision of low-risk first pregnancy in third trimester  - Culture, beta strep (group b only) - GC/Chlamydia probe amp ([place])not at [facility] - Growth [place] today, results pending - Doing well.  -BP good.  -Discussed labor process.   Preterm labor symptoms and general obstetric precautions including but not limited to  vaginal bleeding, contractions, leaking of fluid and fetal movement were reviewed in detail with the patient. Please refer to After Visit Summary for other counseling recommendations.   Return in about 1 week (around 11/25/2018), or [facility] for virtual visit.  No future appointments.  Noni Saupe, NP

## 2018-11-20 LAB — GC/CHLAMYDIA PROBE AMP (~~LOC~~) NOT AT ARMC
Chlamydia: NEGATIVE
Neisseria Gonorrhea: NEGATIVE

## 2018-11-21 LAB — CULTURE, BETA STREP (GROUP B ONLY): Strep Gp B Culture: POSITIVE — AB

## 2018-11-25 ENCOUNTER — Encounter: Payer: Self-pay | Admitting: Medical

## 2018-11-25 ENCOUNTER — Telehealth (INDEPENDENT_AMBULATORY_CARE_PROVIDER_SITE_OTHER): Payer: Medicaid Other | Admitting: Medical

## 2018-11-25 ENCOUNTER — Other Ambulatory Visit: Payer: Self-pay

## 2018-11-25 DIAGNOSIS — O99820 Streptococcus B carrier state complicating pregnancy: Secondary | ICD-10-CM | POA: Insufficient documentation

## 2018-11-25 DIAGNOSIS — Z3403 Encounter for supervision of normal first pregnancy, third trimester: Secondary | ICD-10-CM

## 2018-11-25 DIAGNOSIS — Z3A37 37 weeks gestation of pregnancy: Secondary | ICD-10-CM

## 2018-11-25 NOTE — Progress Notes (Signed)
Pt stated does not have BP Cuff with her at this time., Has been recording in BRx , last reading was on 07/05: 114/78

## 2018-11-25 NOTE — Progress Notes (Signed)
I connected with Madeline Garcia on 11/25/18 at 10:55 AM EDT by: MyChart and verified that I am speaking with the correct person using two identifiers.  Patient is located at work and provider is located at [employer].     The purpose of this virtual visit is to provide medical care while limiting exposure to the novel coronavirus. I discussed the limitations, risks, security and privacy concerns of performing an evaluation and management service by MyChart and the availability of in person appointments. I also discussed with the patient that there may be a patient responsible charge related to this service. By engaging in this virtual visit, you consent to the provision of healthcare.  Additionally, you authorize for your insurance to be billed for the services provided during this visit.  The patient expressed understanding and agreed to proceed.  The following staff members participated in the virtual visit:  Carver Fila, CMA    PRENATAL VISIT NOTE  Subjective:  Madeline Garcia is a 23 y.o. G1P0 at [redacted]w[redacted]d  for phone visit for ongoing prenatal care.  She is currently monitored for the following issues for this low-risk pregnancy and has Supervision of low-risk first pregnancy; Obesity affecting pregnancy in second trimester; and Anemia affecting pregnancy in third trimester on their problem list.  Patient reports no complaints.  Contractions: Not present. Vag. Bleeding: None.  Movement: Present. Denies leaking of fluid.   The following portions of the patient's history were reviewed and updated as appropriate: allergies, current medications, past family history, past medical history, past social history, past surgical history and problem list.   Objective:  There were no vitals filed for this visit. Self-Obtained  Fetal Status:     Movement: Present     Assessment and Plan:  Pregnancy: G1P0 at [redacted]w[redacted]d 1. Encounter for supervision of low-risk first pregnancy in third trimester - Doing  well, no complaints, no contractions, VB or LOF, +FM - +GBS results discussed with patient, will treat in labor   Term labor symptoms and general obstetric precautions including but not limited to vaginal bleeding, contractions, leaking of fluid and fetal movement were reviewed in detail with the patient.  Return in about 1 week (around 12/02/2018) for LOB, Virtual.  Future Appointments  Date Time Provider [place]  12/02/2018  9:15 AM Tresea Mall, CNM [facility]WOCA [place]  12/09/2018  8:55 AM Wende Mott, CNM [facility] [place]  12/16/2018 10:15 AM Manya Silvas, [place]     Time spent on virtual visit: 10 minutes  Kerry Hough, PA-C

## 2018-11-25 NOTE — Patient Instructions (Signed)
Fetal Movement Counts Patient Name: ________________________________________________ Patient Due Date: ____________________ What is a fetal movement count?  A fetal movement count is the number of times that you feel your baby move during a certain amount of time. This may also be called a fetal kick count. A fetal movement count is recommended for every pregnant woman. You may be asked to start counting fetal movements as early as week 28 of your pregnancy. Pay attention to when your baby is most active. You may notice your baby's sleep and wake cycles. You may also notice things that make your baby move more. You should do a fetal movement count:  When your baby is normally most active.  At the same time each day. A good time to count movements is while you are resting, after having something to eat and drink. How do I count fetal movements? 1. Find a quiet, comfortable area. Sit, or lie down on your side. 2. Write down the date, the start time and stop time, and the number of movements that you felt between those two times. Take this information with you to your health care visits. 3. For 2 hours, count kicks, flutters, swishes, rolls, and jabs. You should feel at least 10 movements during 2 hours. 4. You may stop counting after you have felt 10 movements. 5. If you do not feel 10 movements in 2 hours, have something to eat and drink. Then, keep resting and counting for 1 hour. If you feel at least 4 movements during that hour, you may stop counting. Contact a health care provider if:  You feel fewer than 4 movements in 2 hours.  Your baby is not moving like he or she usually does. Date: ____________ Start time: ____________ Stop time: ____________ Movements: ____________ Date: ____________ Start time: ____________ Stop time: ____________ Movements: ____________ Date: ____________ Start time: ____________ Stop time: ____________ Movements: ____________ Date: ____________ Start time:  ____________ Stop time: ____________ Movements: ____________ Date: ____________ Start time: ____________ Stop time: ____________ Movements: ____________ Date: ____________ Start time: ____________ Stop time: ____________ Movements: ____________ Date: ____________ Start time: ____________ Stop time: ____________ Movements: ____________ Date: ____________ Start time: ____________ Stop time: ____________ Movements: ____________ Date: ____________ Start time: ____________ Stop time: ____________ Movements: ____________ This information is not intended to replace advice given to you by your health care provider. Make sure you discuss any questions you have with your health care provider. Document Released: 05/23/2006 Document Revised: 05/13/2018 Document Reviewed: 06/02/2015 Elsevier Patient Education  2020 [employer] Braxton Hicks Contractions Contractions of the uterus can occur throughout pregnancy, but they are not always a sign that you are in labor. You may have practice contractions called Braxton Hicks contractions. These false labor contractions are sometimes confused with true labor. What are Braxton Hicks contractions? Braxton Hicks contractions are tightening movements that occur in the muscles of the uterus before labor. Unlike true labor contractions, these contractions do not result in opening (dilation) and thinning of the cervix. Toward the end of pregnancy (32-34 weeks), Braxton Hicks contractions can happen more often and may become stronger. These contractions are sometimes difficult to tell apart from true labor because they can be very uncomfortable. You should not feel embarrassed if you go to the hospital with false labor. Sometimes, the only way to tell if you are in true labor is for your health care provider to look for changes in the cervix. The health care provider will do a physical exam and may monitor your contractions. If you   are not in true labor, the exam should show  that your cervix is not dilating and your water has not broken. If there are no other health problems associated with your pregnancy, it is completely safe for you to be sent home with false labor. You may continue to have Braxton Hicks contractions until you go into true labor. How to tell the difference between true labor and false labor True labor  Contractions last 30-70 seconds.  Contractions become very regular.  Discomfort is usually felt in the top of the uterus, and it spreads to the lower abdomen and low back.  Contractions do not go away with walking.  Contractions usually become more intense and increase in frequency.  The cervix dilates and gets thinner. False labor  Contractions are usually shorter and not as strong as true labor contractions.  Contractions are usually irregular.  Contractions are often felt in the front of the lower abdomen and in the groin.  Contractions may go away when you walk around or change positions while lying down.  Contractions get weaker and are shorter-lasting as time goes on.  The cervix usually does not dilate or become thin. Follow these instructions at home:   Take over-the-counter and prescription medicines only as told by your health care provider.  Keep up with your usual exercises and follow other instructions from your health care provider.  Eat and drink lightly if you think you are going into labor.  If Braxton Hicks contractions are making you uncomfortable: ? Change your position from lying down or resting to walking, or change from walking to resting. ? Sit and rest in a tub of warm water. ? Drink enough fluid to keep your urine pale yellow. Dehydration may cause these contractions. ? Do slow and deep breathing several times an hour.  Keep all follow-up prenatal visits as told by your health care provider. This is important. Contact a health care provider if:  You have a fever.  You have continuous pain in  your abdomen. Get help right away if:  Your contractions become stronger, more regular, and closer together.  You have fluid leaking or gushing from your vagina.  You pass blood-tinged mucus (bloody show).  You have bleeding from your vagina.  You have low back pain that you never had before.  You feel your baby's head pushing down and causing pelvic pressure.  Your baby is not moving inside you as much as it used to. Summary  Contractions that occur before labor are called Braxton Hicks contractions, false labor, or practice contractions.  Braxton Hicks contractions are usually shorter, weaker, farther apart, and less regular than true labor contractions. True labor contractions usually become progressively stronger and regular, and they become more frequent.  Manage discomfort from Braxton Hicks contractions by changing position, resting in a warm bath, drinking plenty of water, or practicing deep breathing. This information is not intended to replace advice given to you by your health care provider. Make sure you discuss any questions you have with your health care provider. Document Released: 09/06/2016 Document Revised: 04/05/2017 Document Reviewed: 09/06/2016 Elsevier Patient Education  2020 Elsevier Inc.  

## 2018-11-27 ENCOUNTER — Encounter: Payer: Self-pay | Admitting: Family Medicine

## 2018-12-02 ENCOUNTER — Telehealth (INDEPENDENT_AMBULATORY_CARE_PROVIDER_SITE_OTHER): Payer: Medicaid Other | Admitting: Advanced Practice Midwife

## 2018-12-02 ENCOUNTER — Encounter: Payer: Self-pay | Admitting: Advanced Practice Midwife

## 2018-12-02 ENCOUNTER — Other Ambulatory Visit: Payer: Self-pay

## 2018-12-02 VITALS — BP 115/78 | HR 78

## 2018-12-02 DIAGNOSIS — Z3A38 38 weeks gestation of pregnancy: Secondary | ICD-10-CM | POA: Diagnosis not present

## 2018-12-02 DIAGNOSIS — Z3403 Encounter for supervision of normal first pregnancy, third trimester: Secondary | ICD-10-CM | POA: Diagnosis not present

## 2018-12-02 NOTE — Progress Notes (Signed)
   TELEHEALTH VIRTUAL OBSTETRICS VISIT ENCOUNTER NOTE  I connected with Madeline Garcia on 12/02/18 at  9:15 AM EDT by telephone at home and verified that I am speaking with the correct person using two identifiers.   I discussed the limitations, risks, security and privacy concerns of performing an evaluation and management service by telephone and the availability of in person appointments. I also discussed with the patient that there may be a patient responsible charge related to this service. The patient expressed understanding and agreed to proceed.  Subjective:  Madeline Garcia is a 23 y.o. G1P0 at [redacted]w[redacted]d being followed for ongoing prenatal care.  She is currently monitored for the following issues for this low-risk pregnancy and has Supervision of low-risk first pregnancy; Obesity affecting pregnancy in second trimester; Anemia affecting pregnancy in third trimester; and Group B streptococcal carriage complicating pregnancy on their problem list.  Patient reports no complaints. Reports fetal movement. Denies any contractions, bleeding or leaking of fluid.   The following portions of the patient's history were reviewed and updated as appropriate: allergies, current medications, past family history, past medical history, past social history, past surgical history and problem list.   Objective:   General:  Alert, oriented and cooperative.   Mental Status: Normal mood and affect perceived. Normal judgment and thought content.  Rest of physical exam deferred due to type of encounter  Assessment and Plan:  Pregnancy: G1P0 at [redacted]w[redacted]d 1. Encounter for supervision of low-risk first pregnancy in third trimester - Routine care  Term labor symptoms and general obstetric precautions including but not limited to vaginal bleeding, contractions, leaking of fluid and fetal movement were reviewed in detail with the patient.  I discussed the assessment and treatment plan with the patient. The  patient was provided an opportunity to ask questions and all were answered. The patient agreed with the plan and demonstrated an understanding of the instructions. The patient was advised to call back or seek an in-person office evaluation/go to [facility]U at [facility] for any urgent or concerning symptoms. Please refer to After Visit Summary for other counseling recommendations.   I provided 15 minutes of non-face-to-face time during this encounter.  Return in about 1 week (around 12/09/2018).  Future Appointments  Date Time Provider Department Center  12/09/2018  8:55 AM Erick [facility] [place]  12/16/2018 10:15 AM Tamala Julian, [place], CNM [facility] [place], CNM  12/02/18  9:23 AM  [facility]

## 2018-12-09 ENCOUNTER — Telehealth (INDEPENDENT_AMBULATORY_CARE_PROVIDER_SITE_OTHER): Payer: Medicaid Other

## 2018-12-09 ENCOUNTER — Other Ambulatory Visit: Payer: Self-pay

## 2018-12-09 VITALS — BP 121/89 | HR 93

## 2018-12-09 DIAGNOSIS — Z3403 Encounter for supervision of normal first pregnancy, third trimester: Secondary | ICD-10-CM

## 2018-12-09 DIAGNOSIS — Z3A39 39 weeks gestation of pregnancy: Secondary | ICD-10-CM

## 2018-12-09 DIAGNOSIS — O99820 Streptococcus B carrier state complicating pregnancy: Secondary | ICD-10-CM

## 2018-12-09 NOTE — Progress Notes (Signed)
   TELEHEALTH VIRTUAL OBSTETRICS VISIT ENCOUNTER NOTE  I connected with Madeline Garcia on 12/09/18 at  8:55 AM EDT by MyChart virtual visit at home and verified that I am speaking with the correct person using two identifiers.   I discussed the limitations, risks, security and privacy concerns of performing an evaluation and management service by telephone and the availability of in person appointments. I also discussed with the patient that there may be a patient responsible charge related to this service. The patient expressed understanding and agreed to proceed.  Subjective:  Madeline Garcia is a 23 y.o. G1P0 at [redacted]w[redacted]d being followed for ongoing prenatal care.  She is currently monitored for the following issues for this low-risk pregnancy and has Supervision of low-risk first pregnancy; Obesity affecting pregnancy in second trimester; Anemia affecting pregnancy in third trimester; and Group B streptococcal carriage complicating pregnancy on their problem list.  Patient reports no complaints. Reports fetal movement. Denies any contractions, bleeding or leaking of fluid.   The following portions of the patient's history were reviewed and updated as appropriate: allergies, current medications, past family history, past medical history, past social history, past surgical history and problem list.   Objective:   General:  Alert, oriented and cooperative.   Mental Status: Normal mood and affect perceived. Normal judgment and thought content.  Rest of physical exam deferred due to type of encounter  Assessment and Plan:  Pregnancy: G1P0 at [redacted]w[redacted]d 1. Encounter for supervision of low-risk first pregnancy in third trimester -BP 121/89 -Patient doing well, without complaints -Discussed IOL at 41 weeks and typical methods of IOL -Next visit in person for NST  2. Group B streptococcal carriage complicating pregnancy -prophylaxis in labor  Term labor symptoms and general obstetric  precautions including but not limited to vaginal bleeding, contractions, leaking of fluid and fetal movement were reviewed in detail with the patient.  I discussed the assessment and treatment plan with the patient. The patient was provided an opportunity to ask questions and all were answered. The patient agreed with the plan and demonstrated an understanding of the instructions. The patient was advised to call back or seek an in-person office evaluation/go to [facility] at [facility] for any urgent or concerning symptoms. Please refer to After Visit Summary for other counseling recommendations.   I provided 10 minutes of non-face-to-face time during this encounter.  Return in about 1 week (around 12/16/2018) for Return OB visit.  Future Appointments  Date Time Provider [place]  12/16/2018 10:15 AM [place], [place], [place], [place] for [employer], [place]

## 2018-12-10 ENCOUNTER — Telehealth (HOSPITAL_COMMUNITY): Payer: Self-pay | Admitting: *Deleted

## 2018-12-10 NOTE — Telephone Encounter (Signed)
Preadmission screen  

## 2018-12-11 ENCOUNTER — Telehealth (HOSPITAL_COMMUNITY): Payer: Self-pay | Admitting: *Deleted

## 2018-12-11 ENCOUNTER — Encounter (HOSPITAL_COMMUNITY): Payer: Self-pay | Admitting: *Deleted

## 2018-12-11 NOTE — Telephone Encounter (Signed)
Preadmission screen  

## 2018-12-12 ENCOUNTER — Telehealth: Payer: Self-pay

## 2018-12-12 NOTE — Telephone Encounter (Signed)
Received VM from [place], HR, asking for [place] to confirm that we are removing the pt out of work due to [place] [address] or other reasons.  Lauren requested to called at [phone] ext [phone]

## 2018-12-15 DIAGNOSIS — Z029 Encounter for administrative examinations, unspecified: Secondary | ICD-10-CM

## 2018-12-15 NOTE — Telephone Encounter (Signed)
Called back and spoke with Albina Billet in HR to advise of infromation presented from our office to remove patient from work to deliver baby. Advised patient is scheduled to deliver in 7 days. She veriofied understanding and stated she would notate the call.

## 2018-12-16 ENCOUNTER — Other Ambulatory Visit: Payer: Self-pay

## 2018-12-16 ENCOUNTER — Ambulatory Visit (INDEPENDENT_AMBULATORY_CARE_PROVIDER_SITE_OTHER): Payer: Medicaid Other | Admitting: Advanced Practice Midwife

## 2018-12-16 ENCOUNTER — Other Ambulatory Visit: Payer: Self-pay | Admitting: Advanced Practice Midwife

## 2018-12-16 VITALS — BP 121/84 | HR 112 | Wt 260.0 lb

## 2018-12-16 DIAGNOSIS — O99213 Obesity complicating pregnancy, third trimester: Secondary | ICD-10-CM

## 2018-12-16 DIAGNOSIS — O99820 Streptococcus B carrier state complicating pregnancy: Secondary | ICD-10-CM

## 2018-12-16 DIAGNOSIS — Z3403 Encounter for supervision of normal first pregnancy, third trimester: Secondary | ICD-10-CM

## 2018-12-16 DIAGNOSIS — O99212 Obesity complicating pregnancy, second trimester: Secondary | ICD-10-CM

## 2018-12-16 DIAGNOSIS — O99013 Anemia complicating pregnancy, third trimester: Secondary | ICD-10-CM

## 2018-12-16 DIAGNOSIS — Z3A40 40 weeks gestation of pregnancy: Secondary | ICD-10-CM

## 2018-12-16 NOTE — Patient Instructions (Addendum)
Braxton Hicks Contractions Contractions of the uterus can occur throughout pregnancy, but they are not always a sign that you are in labor. You may have practice contractions called Braxton Hicks contractions. These false labor contractions are sometimes confused with true labor. What are Braxton Hicks contractions? Braxton Hicks contractions are tightening movements that occur in the muscles of the uterus before labor. Unlike true labor contractions, these contractions do not result in opening (dilation) and thinning of the cervix. Toward the end of pregnancy (32-34 weeks), Braxton Hicks contractions can happen more often and may become stronger. These contractions are sometimes difficult to tell apart from true labor because they can be very uncomfortable. You should not feel embarrassed if you go to the hospital with false labor. Sometimes, the only way to tell if you are in true labor is for your health care provider to look for changes in the cervix. The health care provider will do a physical exam and may monitor your contractions. If you are not in true labor, the exam should show that your cervix is not dilating and your water has not broken. If there are no other health problems associated with your pregnancy, it is completely safe for you to be sent home with false labor. You may continue to have Braxton Hicks contractions until you go into true labor. How to tell the difference between true labor and false labor True labor  Contractions last 30-70 seconds.  Contractions become very regular.  Discomfort is usually felt in the top of the uterus, and it spreads to the lower abdomen and low back.  Contractions do not go away with walking.  Contractions usually become more intense and increase in frequency.  The cervix dilates and gets thinner. False labor  Contractions are usually shorter and not as strong as true labor contractions.  Contractions are usually irregular.  Contractions  are often felt in the front of the lower abdomen and in the groin.  Contractions may go away when you walk around or change positions while lying down.  Contractions get weaker and are shorter-lasting as time goes on.  The cervix usually does not dilate or become thin. Follow these instructions at home:   Take over-the-counter and prescription medicines only as told by your health care provider.  Keep up with your usual exercises and follow other instructions from your health care provider.  Eat and drink lightly if you think you are going into labor.  If Braxton Hicks contractions are making you uncomfortable: ? Change your position from lying down or resting to walking, or change from walking to resting. ? Sit and rest in a tub of warm water. ? Drink enough fluid to keep your urine pale yellow. Dehydration may cause these contractions. ? Do slow and deep breathing several times an hour.  Keep all follow-up prenatal visits as told by your health care provider. This is important. Contact a health care provider if:  You have a fever.  You have continuous pain in your abdomen. Get help right away if:  Your contractions become stronger, more regular, and closer together.  You have fluid leaking or gushing from your vagina.  You pass blood-tinged mucus (bloody show).  You have bleeding from your vagina.  You have low back pain that you never had before.  You feel your baby's head pushing down and causing pelvic pressure.  Your baby is not moving inside you as much as it used to. Summary  Contractions that occur before labor are   called Braxton Hicks contractions, false labor, or practice contractions.  Braxton Hicks contractions are usually shorter, weaker, farther apart, and less regular than true labor contractions. True labor contractions usually become progressively stronger and regular, and they become more frequent.  Manage discomfort from [facility] Hicks contractions  by changing position, resting in a warm bath, drinking plenty of water, or practicing deep breathing. This information is not intended to replace advice given to you by your health care provider. Make sure you discuss any questions you have with your health care provider. Document Released: 09/06/2016 Document Revised: 04/05/2017 Document Reviewed: 09/06/2016 Elsevier Patient Education  2020 Elsevier Inc.  AREA PEDIATRIC/FAMILY PRACTICE PHYSICIANS  [place] ([id number]) . [facility] Melodie Bouillono Chambliss, MD; Lum BabeEniola, MD; Sheffield SliderHale, MD; Leveda AnnaHensel, MD; McDiarmid, MD; Jerene BearsMcIntyer, MD; Jennette KettleNeal, MD; Gwendolyn GrantWalden, MD o [address] [place], [place] [address] o [phone] o Mon-Fri 8:30-12:30, 1:30-5:00 o Providers come to see babies at [facility] o Accepting Medicaid . [facility] o Limited providers who accept newborns: Docia ChuckKoirala, MD; Kateri PlummerMorrow, MD; Paulino RilyWolters, MD o [address], [place], [place] [address] o [phone] o Mon-Fri 8:00-5:30 o Babies seen by providers at [facility] o Does NOT accept Medicaid o Please call early in hospitalization for appointment (limited availability)  . [facility] Fatima Sangero [place], MD o [address] [place], [place] [address] o [phone] o Mon, Tue, Thur, Fri 8:30-5:00, Wed 10:00-7:00 (closed 1-2pm) o Babies seen by [facility] providers o Accepting Medicaid . Donnie Coffinubin - Pediatrician Fae Pippino Rubin, MD o [address], [place], [place] [address] o [phone] o Mon-Fri 8:30-5:00, Sat 8:30-12:00 o Provider comes to see babies at [facility] o Accepting Medicaid o Must have been referred from current patients or contacted office prior to delivery . [facility] Leotis Paino Brown, MD; Ave Filterhandler, MD; Luna FuseEttefagh, MD; Kennedy BuckerGrant, MD; Konrad DoloresLester, MD; Kathlene NovemberMcCormick, MD; Jenne CampusMcQueen, MD; Lubertha SouthProse, MD; Wynetta EmerySimha, MD; Duffy RhodyStanley, MD;  Gerre CouchStryffeler, NP; Shirl Harrisebben, NP o [address], [place], [place] [address] o [phone] o Mon, Tue, Thur, Fri 8:30-5:30, Wed 9:30-5:30, Sat 8:30-12:30 o Babies seen by [facility] providers o Accepting Medicaid o Only accepting infants of first-time parents or siblings of current patients [facility] discharge coordinator will make follow-up appointment . Cyril MourningJack Amos o [address], [place], [place]  [address] o [phone]   Fax - [phone] . [facility] o [address], [place], [place]  [address] o Phone - [phone]   Fax - [phone] . Lucio EdwardShilpa Gosrani o [address], [place], [place]  [address] o [phone]  [place] [phone]) . [facility] Jorge Mandrilo Bates, MD; Alita ChyleBrassfield, MD; Princella Ionooper, Cox, MD; MD; Earlene Plateravis, MD; Jamesetta Orleansovico, MD; Alvera NovelEttefaugh, MD; Clarene DukeLittle, MD; Rana SnareLowe, MD; Carmon GinsbergKeiffer, MD; Alinda MoneyMelvin, MD; Hosie PoissonSumner, MD; Mayford KnifeWilliams, MD o [address], [place], [place] [address] o [phone] o Mon-Fri 8:30-5:00 (extended evenings Mon-Thur as needed), Sat-Sun 10:00-1:00 o Providers come to see babies at [facility] o Accepting Medicaid for families of first-time babies and families with all children in the household age 353 and under. Must register with office prior to making appointment (M-F only). Alric Quan. [facility] Odella Aquaso Henson, NP; Lynelle DoctorKnapp, MD; Susann GivensLalonde, MD; Bear Valleyysinger, [place] o [address] [place], [place] [address] o [phone] o Mon-Fri 8:00-5:00 o Babies seen by providers at [facility] o Does NOT accept Medicaid/Commercial Insurance Only . [facility] [place] ([facility])  Suzette Battiesto Artis, MD; Zachery DauerBarnes, MD; Stefan ChurchBratton, MD; Sabino Dickoccaro, MD; Quitman LivingsLockett Gardner, MD; Farris HasKramer, MD; Gaynell FaceMarshall, MD; Betha LoaNetherton, MD; Colon FlatteryPoleto, MD; Clifton JamesSkinner, MD o [address] [place], [place] [address] o [phone] o Mon-Fri 8:30-5:30, Sat (Oct.-Mar.) 9:00-1:00 o Babies seen by providers at [facility] o  Accepting [facility] [place] [facility] YouthMedicaid  [place] [phone]) . [facility] [place] o Reid, MD; Sheliah HatchWarner, MD o [address], [place], [place] [address] o [phone] o Mon-Fri 8:30-5:00, Sat 8:30-12:00 o Providers come to see babies at [facility] o Does NOT accept Medicaid . [facility] Cindy Hazyo Becker, [place]; Cross KeysHagler, MD; HartlandScifres, [place]; Wynelle LinkSun, MD; Azucena CecilSwayne, MD o [address], [place], [place] [address] o [phone] o Mon-Fri 8:00-5:00 o Babies seen by providers at [facility] o Does NOT accept Medicaid o Only accepting babies of parents who are patients o Please call early in hospitalization for appointment (limited availability) . [facility] Lamar Beneso Clark, MD; Abran CantorFrye, MD; Early OsmondKelleher, MD; Cherre HugerMack, NP; Hyacinth MeekerMiller, MD; Dwan Bolt'Keller, MD; Jarold MottoPatterson, NP; Dario GuardianPudlo, MD; Talmage NapPuzio, MD; Maisie Fushomas, MD; Pricilla Holmucker, MD; Tama Highwiselton, MD o [address], [place], [place] [address] o [phone] o Mon-Fri 8:00-5:00, Sat 9:00-12:00 o Providers come to see babies at [facility] o Does NOT accept [facility]Medicaid  [place] [phone]) . [facility] o Limited providers accepting new patients: Drema PryBrake, NP; New TripoliWharton, PA o [address], [place], [place] [address] o [phone] o Mon-Fri 8:00-5:00 o Babies seen by providers at [facility] o Does NOT accept Medicaid o Only accepting babies of parents who are patients o Please call early in hospitalization for appointment (limited availability) . [facility] Luan Pullingo Gay, MD; Nash DimmerQuinlan, MD o [address] [place], [place] [address] o [phone] (press [phone] to schedule appointment) o Mon-Fri 8:00-5:00 o Providers come to see babies at [facility] o Does NOT accept Medicaid . [facility] Cristino Marteso Mazer, MD o [address] [place], [place] [address] o [phone] o Mon-Fri 8:30-5:00 (lunch 12:30-1:00), extended hours by appointment only Wed 5:00-6:30 o Babies seen by [facility] providers o Accepting Medicaid . [facility] o Banks, MD; SwazilandJordan, MD; Hassan RowanKoberlein, MD o [address], [place], [place] [address] o [phone] o Mon-Fri 8:00-5:00 o Babies seen by [facility] providers o Does NOT accept Medicaid . Nature conservation [facility]LeB[facility] at [facility] [address] [place] Sohoo Parker, MD; Durene CalHunter, MD; Wind RidgeWallace, DO o [address] [place], [place] [address] o [phone] o Mon-Fri 8:00-5:00 o Babies seen by [facility] providers o Does NOT accept Medicaid . [facility] o Spring CityBrandon, [place]; Nantic[place] AcresBrecken, [place]; Mainvillehristy, NP; Avis Epleyees, MD; Vonna KotykeClaire, MD; Clance BolleWeese, MD; Stevphen RochesterHansen, NP; Arvilla MarketMills, NP; Ann MakiParrish, NP; Otis DialsSmoot, NP; Vaughan BastaSummer, MD; WestfieldVapne, MD o [address] [place], [place] [address] o [phone] o Mon-Fri 8:30-5:00, Sat 10:00-1:00 o Providers come to see babies at [facility] o Does NOT accept Medicaid o Free prenatal information session Tuesdays at 4:45pm . [facility] Luna Kitchenso Bouska, MD; Garza-Salinas IIGordon, [place]; GarwoodJeffery, [place]; Weber, [place] o [address] [place] [address] o [phone] o Mon-Fri 7:30-5:30 o Babies seen by [facility] providers . [facility] o [address], [place], [place]  [address] o [phone]   Fax - [phone]  [place] [phone]) . [facility] Alphonsa Overallo Reese, MD o [address] [place], [place] [address] o [phone] o Mon-Thur 8:00-6:00 o Providers come to see babies  at [facility] o Accepting Medicaid . [facility] Zenon Mayoo Anderson, NP; Cyndia BentBadger, MD; NorveltBeal, [place]; Center PointSpencer, [place] o [address] [place], [place] [address] o [phone] o Mon-Thur 7:30-7:30, Fri 7:30-4:30 o Babies seen by [facility] providers o Accepting Medicaid . [facility] Cheryle Horsfallo Agbuya, MD; Janene HarveyKlett, NP; Vonita Mossomgoolam, MD o [address], [place], [place] [address] o [phone] o Mon-Fri 8:30-5:00, Sat 8:30-12:00 o  Providers come to see babies at [facility] o Accepting Medicaid o Must have "Meet & Greet" appointment at office prior to delivery . [facility] [place] ([facility] [place]) Llana Alimento McCord, MD; Earlene PlaterWallace, MD; Lucretia RoersWood, MD o [address], [place], [place] [address] o [phone] o Mon-Wed 8:00-6:00, Thur-Fri 8:00-5:00, Sat 9:00-12:00 o Providers come to see babies at [facility] o Does NOT accept Medicaid o Only accepting siblings of current patients . [facility] [place]  o [address], [place], [place]  [address] o [phone]   Fax - [phone] . [facility] at [facility] Jeanette o [phone] N[address], [place], [place]  [address] o [phone]   Fax - [phone]  [place]west [place] [phone]) . Nature conservation officerLeBauer HealthCare at [facility] o Loon Lakeirigliano, DO; VernonMatthews, DO o [address] [place], [place] [address] o [phone] o Mon-Fri 7:00-5:00 o Babies seen by [facility] providers o Does NOT accept Medicaid . [facility] Ellis Savageo Briscoe, MD; EurekaHowley, [place]; St. Vincent CollegeMoreira, [place] o [address], [place], [place] [address] o [phone] o Mon-Fri 8:00-5:00 o Babies seen by [facility] providers o Accepting Medicaid . [facility] - [address]Ad[address] Franne Fortso Boyd, MD; Fox Crossinghurch, [place]; Red CrossJones, NP; WebberOsborn, [place] o [address], [place], [place] [address] o [phone] o Mon-Fri 8:00-5:00 o Babies seen by providers at [facility] o Accepting [facility] HospitalMedicaid  [place] [phone]) . [facility] [place] Jarrello Wendling, [place] o [address] [place], [place] [address] o [phone] o Mon-Fri 8:00-5:00 o Babies seen by [facility] providers o Does NOT accept Medicaid o Limited availability, please call early in hospitalization to schedule follow-up . [facility] Jolee Ewingo Calderon, PA;  Eddie Candleummings, MD; Pollock PinesDillard, MD; WinfieldMartin, [place]; Constance Goltzlson, MD; HomerVanDeven, [place] o [id number] [facility] [address], [place], [place] [address] o [phone] o Mon-Fri 8:30-5:00, Sat 9:00-12:00 o Babies seen by providers at [facility] o Accepting Medicaid o Please register online then schedule online or call office o [email or web address] . [facility]) Samuella Bruino Hunter, NP; Lucianne MussKumar, MD; Lanier ClamMartin Rogers, PA o [address], [place], [place] [address] o [phone] o Mon-Fri 8:00-5:00 o Babies seen by providers at [facility] o Accepting Medicaid . [facility] ([facility]) Sharin Monso [place], MD; Reed BreechKristi Fleenor, NP; Shelva MajesticWest, MD o [address], [place], [place] [address] o [phone] o Mon-Fri 8:00-5:30, Sat&Sun by appointment (phones open at 8:30) o Babies seen by [facility] providers o Accepting Medicaid o Must be a first-time baby or sibling of current patient . [facility]  o [address], [address], [place], [place]  [address] o [phone]   Fax - [phone]  [place] [phone] & [phone]) . [facility] o BasinBrown, [place]; [place] Beachowen, [place]PA; Dimple Caseyice, MD; HowardwickHelton, [place]PA; Carolyne FiscalSpry, MD o [address] [place], [place] [address] o [phone]  o Mon-Thur 8:00-7:00, Fri 8:00-5:00, Sat 8:00-12:00, Sun 9:00-12:00 o Babies seen by [facility] providers o Accepting Medicaid . [facility] at [facility], MD; Ruthann Cancer, MD; [facility], MD o [address], [place], [place] [address] o [phone] o Mon-Thur 8:00-5:00 o Babies seen by providers at [facility] o Accepting Medicaid . [facility] - Family Medicine at [place], MD; Coe-Goins, MD; Amedeo Plenty, MD; Bobby Rumpf, MD; List, MD; Lavonia Drafts, MD; Ruthann Cancer, MD; Selinda Eon, MD; Audie Box, MD; Jim Like, MD; Christie Nottingham, MD; Hubbard Hartshorn, MD; Modena Nunnery, MD o  [address] [place], [place] [address] o [phone] o Mon-Fri 8:00-5:30, Sat (Oct.-Mar.) 9:00-1:00 o Babies seen by providers at [facility] o Accepting Medicaid o Must fill out new patient packet, available online at [email or web address] . [place] ([facility]) Barnabas Lister, NP; [place]on Kingfisher, NP; Claiborne Billings, NP; Rolla Plate, MD; [place], [place]; Carola Rhine, MD; Tyron Russell, MD; Delia Chimes, NP o [address], [place], [place] [address] o [phone] o Mon-Thur 8:00-5:30, Fri 8:00-5:00 o Babies seen by providers at [place] [phone]) . [place], [place]; [place], MD; Dennard Schaumann, MD; [place], [place] o [address] [place], [place] [address] o [phone] o Mon-Fri 8:00-5:00 o Babies seen by providers at [place] [phone]) . [place] at [place], [place]; Olen Pel, MD; [place], [place], [place], [place] [address] o [phone] o Mon-Fri 8:00-5:00 o Babies seen by providers at [facility] o Does NOT accept Medicaid o Limited appointment availability, please call early in hospitalization  . Therapist, music at [place], [place]; [place], [address], [place], [place] [address] o [phone] o Mon-Fri 8:00-5:00 o Babies seen by [facility] providers o Does NOT accept Medicaid . [facility] Su Grand, MD; Guy Sandifer, MD; [place], [place]; [place], [address], [place], [place] [address] o [phone] o Mon-Fri 8:00-5:00 o After hours clinic [facility] [place], [place] [address]) [phone] Mon-Fri 5:00-8:00, Sat 12:00-6:00, Sun 10:00-4:00 o Babies seen by [facility] providers o Accepting Medicaid . [place] at [facility] o [address], [place], [place]  [address] o [phone]   Fax - [phone]   [place] [phone]) . Therapist, music at [facility], MD o [address] [place], [place], [place] [address] o [phone] o Mon-Fri 8:00-5:00 o Babies seen by [facility] providers o Does NOT accept Medicaid . [place] ([facility] [place]) Bing Neighbors, MD o [address], [place], [place] [address] o [phone] o Mon-Thur 8:00-7:00, Fri 8:00-5:00, Sat 8:00-12:00 o Babies seen by providers at [facility] o Accepting Medicaid - but does not have vaccinations in office (must be received elsewhere) o Limited availability, please call early in hospitalization  [facility] ([id number]) . [place], [place] [address] [place] [address] o [phone]  Fax [phone]

## 2018-12-16 NOTE — Progress Notes (Signed)
   PRENATAL VISIT NOTE  Subjective:  Madeline Garcia is a 23 y.o. G1P0 at [redacted]w[redacted]d being seen today for ongoing prenatal care.  She is currently monitored for the following issues for this low-risk pregnancy and has Supervision of low-risk first pregnancy; Obesity affecting pregnancy in second trimester; Anemia affecting pregnancy in third trimester; and Group B streptococcal carriage complicating pregnancy on their problem list.  Patient reports occasional contractions.  Contractions: Regular. Vag. Bleeding: None.  Movement: Present. Denies leaking of fluid.   The following portions of the patient's history were reviewed and updated as appropriate: allergies, current medications, past family history, past medical history, past social history, past surgical history and problem list.   Objective:   Vitals:   12/16/18 1026  BP: 121/84  Pulse: (!) 112  Weight: 260 lb (117.9 kg)    Fetal Status: Fetal Heart Rate (bpm): 142   Movement: Present     General:  Alert, oriented and cooperative. Patient is in no acute distress.  Skin: Skin is warm and dry. No rash noted.   Cardiovascular: Normal heart rate noted  Respiratory: Normal respiratory effort, no problems with respiration noted  Abdomen: Soft, gravid, appropriate for gestational age.  Pain/Pressure: Present     Pelvic: Cervical exam performed        Extremities: Normal range of motion.  Edema: None  Mental Status: Normal mood and affect. Normal behavior. Normal judgment and thought content.   Assessment and Plan:  Pregnancy: G1P0 at [redacted]w[redacted]d 1. Encounter for supervision of low-risk first pregnancy in third trimester - IOL scheduled 41 weeks - NST tomorrow  2. Obesity affecting pregnancy in second trimester   3. Group B streptococcal carriage complicating pregnancy - PCN in labor  4. Anemia affecting pregnancy in third trimester   Term labor symptoms and general obstetric precautions including but not limited to vaginal  bleeding, contractions, leaking of fluid and fetal movement were reviewed in detail with the patient. Please refer to After Visit Summary for other counseling recommendations.   No follow-ups on file.  Future Appointments  Date Time Provider [place]  12/20/2018  8:40 AM [facility]-MAU 1 [facility]-INDC None  12/22/2018  7:30 AM [facility]-LD [place] None    Manya Silvas, CNM

## 2018-12-17 ENCOUNTER — Ambulatory Visit: Payer: Self-pay

## 2018-12-17 ENCOUNTER — Ambulatory Visit (INDEPENDENT_AMBULATORY_CARE_PROVIDER_SITE_OTHER): Payer: Medicaid Other | Admitting: General Practice

## 2018-12-17 VITALS — BP 125/71 | HR 98

## 2018-12-17 DIAGNOSIS — O480 Post-term pregnancy: Secondary | ICD-10-CM | POA: Diagnosis not present

## 2018-12-17 NOTE — Progress Notes (Signed)
Pt informed that the ultrasound is considered a limited OB ultrasound and is not intended to be a complete ultrasound exam.  Patient also informed that the ultrasound is not being completed with the intent of assessing for fetal or placental anomalies or any pelvic abnormalities.  Explained that the purpose of today's ultrasound is to assess for  BPP, presentation, AFI and viability.  Patient acknowledges the purpose of the exam and the limitations of the study.    Madeline Bound RN BSN 12/17/18

## 2018-12-20 ENCOUNTER — Other Ambulatory Visit (HOSPITAL_COMMUNITY)
Admission: RE | Admit: 2018-12-20 | Discharge: 2018-12-20 | Disposition: A | Discharge status: Home / Self Care | Payer: Medicaid Other | Source: Ambulatory Visit | Attending: Obstetrics and Gynecology | Admitting: Obstetrics and Gynecology

## 2018-12-20 ENCOUNTER — Other Ambulatory Visit: Payer: Self-pay

## 2018-12-20 DIAGNOSIS — Z01812 Encounter for preprocedural laboratory examination: Secondary | ICD-10-CM | POA: Insufficient documentation

## 2018-12-20 DIAGNOSIS — Z20828 Contact with and (suspected) exposure to other viral communicable diseases: Secondary | ICD-10-CM | POA: Insufficient documentation

## 2018-12-20 LAB — SARS CORONAVIRUS 2 (TAT 6-24 HRS): SARS Coronavirus 2: NEGATIVE

## 2018-12-20 NOTE — MAU Note (Signed)
Pt here for PAT covid swab, denies any symptoms. Swab collected 

## 2018-12-22 ENCOUNTER — Other Ambulatory Visit: Payer: Self-pay

## 2018-12-22 ENCOUNTER — Inpatient Hospital Stay (HOSPITAL_COMMUNITY): Payer: Medicaid Other

## 2018-12-22 ENCOUNTER — Encounter (HOSPITAL_COMMUNITY): Payer: Self-pay | Admitting: *Deleted

## 2018-12-22 ENCOUNTER — Inpatient Hospital Stay (HOSPITAL_COMMUNITY)
Admission: AD | Admit: 2018-12-22 | Discharge: 2018-12-25 | DRG: 807 | Disposition: A | Discharge status: Home / Self Care | Payer: Medicaid Other | Attending: Family Medicine | Admitting: Family Medicine

## 2018-12-22 DIAGNOSIS — O134 Gestational [pregnancy-induced] hypertension without significant proteinuria, complicating childbirth: Secondary | ICD-10-CM | POA: Diagnosis present

## 2018-12-22 DIAGNOSIS — Z3403 Encounter for supervision of normal first pregnancy, third trimester: Secondary | ICD-10-CM

## 2018-12-22 DIAGNOSIS — O99214 Obesity complicating childbirth: Secondary | ICD-10-CM | POA: Diagnosis present

## 2018-12-22 DIAGNOSIS — O99824 Streptococcus B carrier state complicating childbirth: Secondary | ICD-10-CM | POA: Diagnosis present

## 2018-12-22 DIAGNOSIS — D649 Anemia, unspecified: Secondary | ICD-10-CM | POA: Diagnosis present

## 2018-12-22 DIAGNOSIS — O480 Post-term pregnancy: Principal | ICD-10-CM | POA: Diagnosis present

## 2018-12-22 DIAGNOSIS — O9902 Anemia complicating childbirth: Secondary | ICD-10-CM | POA: Diagnosis present

## 2018-12-22 DIAGNOSIS — Z3A41 41 weeks gestation of pregnancy: Secondary | ICD-10-CM

## 2018-12-22 DIAGNOSIS — Z3490 Encounter for supervision of normal pregnancy, unspecified, unspecified trimester: Secondary | ICD-10-CM | POA: Diagnosis present

## 2018-12-22 LAB — COMPREHENSIVE METABOLIC PANEL
ALT: 12 U/L (ref 0–44)
AST: 17 U/L (ref 15–41)
Albumin: 2.7 g/dL — ABNORMAL LOW (ref 3.5–5.0)
Alkaline Phosphatase: 192 U/L — ABNORMAL HIGH (ref 38–126)
Anion gap: 10 (ref 5–15)
BUN: <5 mg/dL — ABNORMAL LOW (ref 6–20)
CO2: 20 mmol/L — ABNORMAL LOW (ref 22–32)
Calcium: 9 mg/dL (ref 8.9–10.3)
Chloride: 102 mmol/L (ref 98–111)
Creatinine, Ser: 0.7 mg/dL (ref 0.44–1.00)
GFR calc Af Amer: >60 mL/min (ref >60)
GFR calc non Af Amer: >60 mL/min (ref >60)
Glucose, Bld: 120 mg/dL — ABNORMAL HIGH (ref 70–99)
Potassium: 3.5 mmol/L (ref 3.5–5.1)
Sodium: 132 mmol/L — ABNORMAL LOW (ref 135–145)
Total Bilirubin: 0.5 mg/dL (ref 0.3–1.2)
Total Protein: 7.3 g/dL (ref 6.5–8.1)

## 2018-12-22 LAB — CBC
HCT: 33.5 % — ABNORMAL LOW (ref 36.0–46.0)
Hemoglobin: 10.8 g/dL — ABNORMAL LOW (ref 12.0–15.0)
MCH: 25.6 pg — ABNORMAL LOW (ref 26.0–34.0)
MCHC: 32.2 g/dL (ref 30.0–36.0)
MCV: 79.4 fL — ABNORMAL LOW (ref 80.0–100.0)
Platelets: 383 10*3/uL (ref 150–400)
RBC: 4.22 MIL/uL (ref 3.87–5.11)
RDW: 13.4 % (ref 11.5–15.5)
WBC: 11 10*3/uL — ABNORMAL HIGH (ref 4.0–10.5)
nRBC: 0 % (ref 0.0–0.2)

## 2018-12-22 LAB — PROTEIN / CREATININE RATIO, URINE
Creatinine, Urine: 45.02 mg/dL
Total Protein, Urine: <6 mg/dL

## 2018-12-22 LAB — TYPE AND SCREEN
ABO/RH(D): B POS
Antibody Screen: NEGATIVE

## 2018-12-22 LAB — ABO/RH: ABO/RH(D): B POS

## 2018-12-22 LAB — RPR: RPR Ser Ql: NONREACTIVE

## 2018-12-22 MED ORDER — MISOPROSTOL 50MCG HALF TABLET
50.0000 ug | ORAL_TABLET | ORAL | Status: DC
  Administered 2018-12-22 (×2): 50 ug via BUCCAL
  Filled 2018-12-22 (×2): qty 1

## 2018-12-22 MED ORDER — DIPHENHYDRAMINE HCL 50 MG/ML IJ SOLN: 12.5000 mg | INTRAMUSCULAR | Status: DC | PRN

## 2018-12-22 MED ORDER — FENTANYL CITRATE (PF) 100 MCG/2ML IJ SOLN
100.0000 ug | INTRAMUSCULAR | Status: DC | PRN
  Administered 2018-12-22 (×4): 100 ug via INTRAVENOUS
  Filled 2018-12-22 (×4): qty 2

## 2018-12-22 MED ORDER — PENICILLIN G 3 MILLION UNITS IVPB - SIMPLE MED
3.0000 10*6.[IU] | INTRAVENOUS | Status: DC
  Administered 2018-12-22 – 2018-12-23 (×6): 3 10*6.[IU] via INTRAVENOUS
  Filled 2018-12-22 (×6): qty 100

## 2018-12-22 MED ORDER — LACTATED RINGERS IV SOLN
INTRAVENOUS | Status: DC
  Administered 2018-12-22 – 2018-12-23 (×5): via INTRAVENOUS

## 2018-12-22 MED ORDER — LACTATED RINGERS IV SOLN
500.0000 mL | Freq: Once | INTRAVENOUS | Status: AC
  Administered 2018-12-23: 05:00:00 500 mL via INTRAVENOUS

## 2018-12-22 MED ORDER — FENTANYL-BUPIVACAINE-NACL 0.5-0.125-0.9 MG/250ML-% EP SOLN
12.0000 mL/h | EPIDURAL | Status: DC | PRN
  Filled 2018-12-22: qty 250

## 2018-12-22 MED ORDER — OXYCODONE-ACETAMINOPHEN 5-325 MG PO TABS: 2.0000 | ORAL_TABLET | ORAL | Status: DC | PRN

## 2018-12-22 MED ORDER — OXYCODONE-ACETAMINOPHEN 5-325 MG PO TABS: 1.0000 | ORAL_TABLET | ORAL | Status: DC | PRN

## 2018-12-22 MED ORDER — ACETAMINOPHEN 325 MG PO TABS: 650.0000 mg | ORAL_TABLET | ORAL | Status: DC | PRN

## 2018-12-22 MED ORDER — FLEET ENEMA 7-19 GM/118ML RE ENEM: 1.0000 | ENEMA | RECTAL | Status: DC | PRN

## 2018-12-22 MED ORDER — LIDOCAINE HCL (PF) 1 % IJ SOLN: 30.0000 mL | INTRAMUSCULAR | Status: DC | PRN

## 2018-12-22 MED ORDER — EPHEDRINE 5 MG/ML INJ: 10.0000 mg | INTRAVENOUS | Status: DC | PRN

## 2018-12-22 MED ORDER — PHENYLEPHRINE 40 MCG/ML (10ML) SYRINGE FOR IV PUSH (FOR BLOOD PRESSURE SUPPORT): 80.0000 ug | PREFILLED_SYRINGE | INTRAVENOUS | Status: DC | PRN

## 2018-12-22 MED ORDER — OXYTOCIN BOLUS FROM INFUSION
500.0000 mL | Freq: Once | INTRAVENOUS | Status: AC
  Administered 2018-12-23: 12:00:00 500 mL via INTRAVENOUS

## 2018-12-22 MED ORDER — TERBUTALINE SULFATE 1 MG/ML IJ SOLN: 0.2500 mg | Freq: Once | INTRAMUSCULAR | Status: DC | PRN

## 2018-12-22 MED ORDER — SODIUM CHLORIDE 0.9 % IV SOLN
5.0000 10*6.[IU] | Freq: Once | INTRAVENOUS | Status: AC
  Administered 2018-12-22: 5 10*6.[IU] via INTRAVENOUS
  Filled 2018-12-22: qty 5

## 2018-12-22 MED ORDER — OXYTOCIN 40 UNITS IN NORMAL SALINE INFUSION - SIMPLE MED: 2.5000 [IU]/h | INTRAVENOUS | Status: DC

## 2018-12-22 MED ORDER — PHENYLEPHRINE 40 MCG/ML (10ML) SYRINGE FOR IV PUSH (FOR BLOOD PRESSURE SUPPORT)
80.0000 ug | PREFILLED_SYRINGE | INTRAVENOUS | Status: DC | PRN
  Filled 2018-12-22: qty 10

## 2018-12-22 MED ORDER — OXYTOCIN 40 UNITS IN NORMAL SALINE INFUSION - SIMPLE MED
1.0000 m[IU]/min | INTRAVENOUS | Status: DC
  Administered 2018-12-22: 2 m[IU]/min via INTRAVENOUS
  Administered 2018-12-22: 6 m[IU]/min via INTRAVENOUS
  Administered 2018-12-22: 8 m[IU]/min via INTRAVENOUS
  Filled 2018-12-22: qty 1000

## 2018-12-22 MED ORDER — LACTATED RINGERS IV SOLN
500.0000 mL | INTRAVENOUS | Status: DC | PRN
  Administered 2018-12-23 (×2): 500 mL via INTRAVENOUS

## 2018-12-22 MED ORDER — ONDANSETRON HCL 4 MG/2ML IJ SOLN: 4.0000 mg | Freq: Four times a day (QID) | INTRAMUSCULAR | Status: DC | PRN

## 2018-12-22 MED ORDER — SOD CITRATE-CITRIC ACID 500-334 MG/5ML PO SOLN: 30.0000 mL | ORAL | Status: DC | PRN

## 2018-12-22 NOTE — Progress Notes (Addendum)
   Subjective: Madeline Garcia is a 23 y.o. G1P0 at [redacted]w[redacted]d by ultrasound admitted for induction of labor due to Post dates. Due date 12/15/2018. Patient not tolerating UC's well; using frequent fentanyl with contractions.   Objective: BP 127/78   Pulse 98   Temp 98.1 F (36.7 C)   LMP 03/10/2018 (Approximate)   FHT:  FHR: 135 bpm, variability: moderate,  accelerations:  Present,  decelerations:  Absent UC:   regular, every 1 minutes SVE:   Dilation: 3.5 Effacement (%): Thick Station: -2 Exam by:: Trease Bremner, cnm  Labs: Lab Results  Component Value Date   WBC 11.0 (H) 12/22/2018   HGB 10.8 (L) 12/22/2018   HCT 33.5 (L) 12/22/2018   MCV 79.4 (L) 12/22/2018   PLT 383 12/22/2018    Assessment / Plan: Induction of labor due to postterm,  progressing well on pitocin  Labor: Progressing normally Preeclampsia:  no signs or symptoms of toxicity Fetal Wellbeing:  Category I Pain Control:  IV pain meds I/D:  group b strep  Anticipated MOD:  NSVD  Poonam Patel 12/22/2018, 5:26 PM  I confirm that I have verified the information documented in the resident's note and that I have also personally reperformed the history, physical exam and all medical decision making activities of this service and have verified that all service and findings are accurately documented in this student's note.    Laury Deep, CNM 12/22/2018 5:47 PM

## 2018-12-22 NOTE — H&P (Addendum)
OBSTETRIC ADMISSION HISTORY AND PHYSICAL  Madeline Garcia is a 23 y.o. female G1P0 with IUP at [redacted]w[redacted]d by LMP with 12 week [place] presenting for IOL postdate  Reports fetal movement. Denies vaginal bleeding.  HPI She received her prenatal care at [facility].  Support person in labor: Shikeem Mebane (FOB)   Nursing Staff Provider  Office Location  [facility] Dating   LMP consistent with 12 week [place]  Language  English Anatomy US   Normal - incomplete - FU scheduled 4/14  Flu Vaccine  Declined Genetic Screen  NIPS:low risk female  AFP: negative      TDaP vaccine   09/23/2018 Hgb A1C or  GTT Early  Third trimester  81     Glucose, 1 hour 65 - 179 mg/dL 134   Glucose, 2 hour 65 - 152 mg/dL 118     Rhogam  NA   LAB RESULTS   Feeding Plan Breast Blood Type B/Positive/-- (02/04 1311)   Contraception Undecided/? depo Antibody Negative (02/04 1311)   Circumcision Yes Rubella 1.34 (02/04 1311)  Pediatrician  List given RPR Non Reactive (02/04 1311)   Support Person Shikeem Mebane HBsAg Negative (02/04 1311)   Prenatal Classes  Info given HIV Non Reactive (02/04 1311)  BTL Consent NA GBS  POSITIVE  VBAC Consent NA Pap  06/25/18 - normal    Hgb Electro   Normal    CF Negative    SMA Negative    Waterbirth  [ ]  Class [ ]  Consent [ ]  CNM visit     Ultrasounds . Anatomy U/S: Normal - incomplete - FU scheduled 4/14  Prenatal History/Complications: . None  Past Medical History: Past Medical History:  Diagnosis Date  . Medical history non-contributory     Past Surgical History: Past Surgical History:  Procedure Laterality Date  . NO PAST SURGERIES      Obstetrical History: OB History    Gravida  1   Para      Term      Preterm      AB      Living        SAB      TAB      Ectopic      Multiple      Live Births              Social History: Social History   Socioeconomic History  . Marital status: Single    Spouse name: Not on file  . Number of children: Not  on file  . Years of education: Not on file  . Highest education level: 12th grade  Occupational History  . Occupation: Biochemist, clinical    Comment: [employer]  . Financial resource strain: Not on file  . Food insecurity    Worry: Never true    Inability: Never true  . Transportation needs    Medical: No    Non-medical: No  Tobacco Use  . Smoking status: Never Smoker  . Smokeless tobacco: Never Used  Substance and Sexual Activity  . Alcohol use: Never    Frequency: Never  . Drug use: Never  . Sexual activity: Yes    Birth control/protection: None  Lifestyle  . Physical activity    Days per week: Not on file    Minutes per session: Not on file  . Stress: Only a little  Relationships  . Social Herbalist on phone: Not on file    Gets  together: Not on file    Attends religious service: Not on file    Active member of club or organization: Not on file    Attends meetings of clubs or organizations: Not on file    Relationship status: Not on file  Other Topics Concern  . Not on file  Social History Narrative  . Not on file    Family History: Family History  Problem Relation Age of Onset  . Cancer Mother        Breast    Allergies: No Known Allergies  Medications Prior to Admission  Medication Sig Dispense Refill Last Dose  . acetaminophen (TYLENOL) 325 MG tablet Take 650 mg by mouth every 6 (six) hours as needed.     . docusate sodium (COLACE) 100 MG capsule Take 1 capsule (100 mg total) by mouth 2 (two) times daily. (Patient not taking: Reported on 10/21/2018) 90 capsule 4   . ferrous sulfate 325 (65 FE) MG tablet Take 1 tablet (325 mg total) by mouth daily with breakfast. (Patient not taking: Reported on 10/21/2018) 30 tablet 4   . Prenatal Vit-Fe Fumarate-FA (PRENATAL MULTIVITAMIN) TABS tablet Take 1 tablet by mouth daily at 12 noon.     . promethazine (PHENERGAN) 25 MG tablet Take 0.5-1 tablets (12.5-25 mg total) by mouth every 6  (six) hours as needed for nausea. (Patient not taking: Reported on 07/23/2018) 30 tablet 2      Review of Systems  All systems reviewed and negative except as stated in HPI  Blood pressure (!) 137/91, pulse (!) 107, temperature 98.1 F (36.7 C), last menstrual period 03/10/2018. General appearance: alert looks well, alert, cooperative  Lungs: no respiratory distress, clear on exam  Heart: regular rate and rhythm Abdomen: soft, non-tender; gravid abdomen  Pelvic: 1/thick/-2  Extremities: Homans sign is negative, no sign of DVT Presentation: cephalic Fetal monitoring: Base line rate: 140, variability 6-25 bpm, accelerations 10 x 10, no decels   Uterine activity: 4 contractions/min, 80 secs, mild, relaxed resting time-adequate  Dilation: 1 Effacement (%): Thick Station: -2 Exam by:: Briarrose Shor cnm  Prenatal labs: ABO, Rh: --/--/B POS, B POS Performed at [facility], [address] [place], [place] [address]  [phone](08/17 0750) Antibody: NEG (08/17 0750) Rubella: 1.34 (02/04 1311) RPR: Non Reactive (05/19 0851)  HBsAg: Negative (02/04 1311)  HIV: Non Reactive (05/19 0851)  GBS:   positive  Glucola:  Fasting 81     Glucose, 1 hour  134   Glucose, 2 hour  118   Genetic screening:  Normal   Prenatal Transfer Tool  Maternal Diabetes: No Genetic Screening: Normal Maternal Ultrasounds/Referrals: Normal Fetal Ultrasounds or other Referrals:  None Maternal Substance Abuse:  No Significant Maternal Medications:  Meds include: Other: Ferrous sulphate, ferrous fumarate, promethazine, docusate, acetaminophen  Significant Maternal Lab Results: Other:  Hb 10.8 on admission   Results for orders placed or performed during the hospital encounter of 12/22/18 (from the past 24 hour(s))  CBC   Collection Time: 12/22/18  7:37 AM  Result Value Ref Range   WBC 11.0 (H) 4.0 - 10.5 K/uL   RBC 4.22 3.87 - 5.11 MIL/uL   Hemoglobin 10.8 (L) 12.0 - 15.0 g/dL   HCT 78.233.5 (L) 95.636.0 - 21.346.0 %   MCV  79.4 (L) 80.0 - 100.0 fL   MCH 25.6 (L) 26.0 - 34.0 pg   MCHC 32.2 30.0 - 36.0 g/dL   RDW 08.613.4 57.811.5 - 46.915.5 %   Platelets 383 150 -  400 K/uL   nRBC 0.0 0.0 - 0.2 %  Type and screen   Collection Time: 12/22/18  7:50 AM  Result Value Ref Range   ABO/RH(D) B POS    Antibody Screen NEG    Sample Expiration      12/25/2018,2359 Performed at [facility], [address] [place], [place] [address]   ABO/Rh   Collection Time: 12/22/18  7:50 AM  Result Value Ref Range   ABO/RH(D)      B POS Performed at [facility], [address] [place], [place] [address]     Patient Active Problem List   Diagnosis Date Noted  . Encounter for induction of labor 12/22/2018  . Group B streptococcal carriage complicating pregnancy 11/25/2018  . Anemia affecting pregnancy in third trimester 09/24/2018  . Obesity affecting pregnancy in second trimester 06/25/2018  . Supervision of low-risk first pregnancy 06/10/2018    Assessment/Plan:  Madeline Garcia is a 23 y.o. G1P0 at 954w0d here for IOL.  Labor:  -- pain control: acetaminophen 650mg  Q4PRN, fentanyl 100mcg IV  Fetal Wellbeing: EFW [id number] by US 11/18/18. Cephalic by VE.  -- GBS (positive) -- continuous fetal monitoring -category I   Postpartum Planning -- breast -- 09/23/2018 Tdap   Towanda OctavePoonam Patel, MD PGY-1, [facility]   I confirm that I have verified the information documented in the resident's note and that I have also personally reperformed the history, physical exam and all medical decision making activities of this service and have verified that all service and findings are accurately documented in this student's note.   Plan to insert FB @ 1200 and continue with Cytotec with progression to Pitocin afterwards.  Raelyn MoraDawson, Traves Majchrzak, CNM 12/22/2018 3:58 PM

## 2018-12-22 NOTE — Progress Notes (Signed)
Labor Progress Note Madeline Garcia is a 23 y.o. G1P0 at [redacted]w[redacted]d presented for IOL for post dates.  S: Feels contractions. Pain tolerable with fentanyl.  O:  BP 134/83   Pulse 94   Temp 99 F (37.2 C) (Oral)   Resp 18   LMP 03/10/2018 (Approximate)  EFM: 150/moderate var/accels    CVE: Dilation: 3.5 Effacement (%): 60 Cervical Position: Posterior Station: -3 Presentation: Vertex Exam by:: Dr Pilar Plate   A&P: 23 y.o. G1P0 [redacted]w[redacted]d progressing well with augmentation. #Labor: Progressing well. Continue pit. #Pain: fentanyl for now. OK for epidural. #FWB: Category I #GBS positive, penicillin Q4 hours #HTN: pre-eclampsia labs unremarkable. Continue to monitor BP. No meds for now.  Matilde Haymaker, MD 11:58 PM

## 2018-12-22 NOTE — Progress Notes (Addendum)
  Subjective: Madeline Garcia is a 23 y.o. G1P0 at [redacted]w[redacted]d by ultrasound admitted for induction of labor due to Post dates. Due date 12/15/2018 Pt still 3.5 dilated. Not tolerating uterine contractions well. Have discussed getting epidural tonight if she would like.  Objective: BP (!) 134/93   Pulse 100   Temp 98.1 F (36.7 C) (Oral)   Resp 18   LMP 03/10/2018 (Approximate)  No intake/output data recorded. No intake/output data recorded.  FHT:  FHR: 130 bpm, variability: moderate,  accelerations:  Present,  decelerations:  Absent UC:   regular, every 1-2 minutes SVE:   Dilation: 3.5 Effacement (%): 60 Station: -3 Exam by:: [employer]: Lab Results  Component Value Date   WBC 11.0 (H) 12/22/2018   HGB 10.8 (L) 12/22/2018   HCT 33.5 (L) 12/22/2018   MCV 79.4 (L) 12/22/2018   PLT 383 12/22/2018    Assessment / Plan: Induction of labor due to postterm ,  progressing well on pitocin  Labor: Progressing normally Preeclampsia:  no signs or symptoms of toxicity  BP 140/89 monitoring for preclampsia  Fetal Wellbeing:  Category I Pain Control:  IV pain meds I/D:  group b strep Anticipated MOD:  NSVD  Poonam Patel 12/22/2018, 8:17 PM  I confirm that I have verified the information documented in the resident's note and that I have also personally reperformed the history, physical exam and all medical decision making activities of this service and have verified that all service and findings are accurately documented in this student's note.    Laury Deep, CNM 12/22/2018 8:30 PM

## 2018-12-23 ENCOUNTER — Inpatient Hospital Stay (HOSPITAL_COMMUNITY): Payer: Medicaid Other | Admitting: Anesthesiology

## 2018-12-23 ENCOUNTER — Other Ambulatory Visit: Payer: Self-pay

## 2018-12-23 DIAGNOSIS — O99824 Streptococcus B carrier state complicating childbirth: Secondary | ICD-10-CM

## 2018-12-23 DIAGNOSIS — Z3A41 41 weeks gestation of pregnancy: Secondary | ICD-10-CM

## 2018-12-23 DIAGNOSIS — O480 Post-term pregnancy: Secondary | ICD-10-CM

## 2018-12-23 LAB — CBC
HCT: 33.7 % — ABNORMAL LOW (ref 36.0–46.0)
Hemoglobin: 11.2 g/dL — ABNORMAL LOW (ref 12.0–15.0)
MCH: 25.9 pg — ABNORMAL LOW (ref 26.0–34.0)
MCHC: 33.2 g/dL (ref 30.0–36.0)
MCV: 77.8 fL — ABNORMAL LOW (ref 80.0–100.0)
Platelets: 341 10*3/uL (ref 150–400)
RBC: 4.33 MIL/uL (ref 3.87–5.11)
RDW: 13.4 % (ref 11.5–15.5)
WBC: 14.5 10*3/uL — ABNORMAL HIGH (ref 4.0–10.5)
nRBC: 0 % (ref 0.0–0.2)

## 2018-12-23 MED ORDER — MAGNESIUM HYDROXIDE 400 MG/5ML PO SUSP: 30.0000 mL | ORAL | Status: DC | PRN

## 2018-12-23 MED ORDER — PRENATAL MULTIVITAMIN CH
1.0000 | ORAL_TABLET | Freq: Every day | ORAL | Status: DC
  Administered 2018-12-24 – 2018-12-25 (×2): 1 via ORAL
  Filled 2018-12-23 (×2): qty 1

## 2018-12-23 MED ORDER — ACETAMINOPHEN 325 MG PO TABS: 650.0000 mg | ORAL_TABLET | ORAL | Status: DC | PRN

## 2018-12-23 MED ORDER — OXYTOCIN 40 UNITS IN NORMAL SALINE INFUSION - SIMPLE MED
10.0000 [IU]/h | INTRAVENOUS | Status: AC
  Administered 2018-12-23: 10 [IU]/h via INTRAVENOUS
  Filled 2018-12-23: qty 1000

## 2018-12-23 MED ORDER — DIBUCAINE (PERIANAL) 1 % EX OINT: 1.0000 "application " | TOPICAL_OINTMENT | CUTANEOUS | Status: DC | PRN

## 2018-12-23 MED ORDER — LACTATED RINGERS AMNIOINFUSION
INTRAVENOUS | Status: DC
  Administered 2018-12-23: 09:00:00 via INTRAUTERINE

## 2018-12-23 MED ORDER — WITCH HAZEL-GLYCERIN EX PADS: 1.0000 "application " | MEDICATED_PAD | CUTANEOUS | Status: DC | PRN

## 2018-12-23 MED ORDER — SENNOSIDES-DOCUSATE SODIUM 8.6-50 MG PO TABS
2.0000 | ORAL_TABLET | ORAL | Status: DC
  Administered 2018-12-23 – 2018-12-24 (×2): 2 via ORAL
  Filled 2018-12-23 (×2): qty 2

## 2018-12-23 MED ORDER — IBUPROFEN 600 MG PO TABS
600.0000 mg | ORAL_TABLET | Freq: Four times a day (QID) | ORAL | Status: DC
  Administered 2018-12-23 – 2018-12-25 (×9): 600 mg via ORAL
  Filled 2018-12-23 (×9): qty 1

## 2018-12-23 MED ORDER — ONDANSETRON HCL 4 MG/2ML IJ SOLN: 4.0000 mg | INTRAMUSCULAR | Status: DC | PRN

## 2018-12-23 MED ORDER — COCONUT OIL OIL: 1.0000 "application " | TOPICAL_OIL | Status: DC | PRN

## 2018-12-23 MED ORDER — ONDANSETRON HCL 4 MG PO TABS: 4.0000 mg | ORAL_TABLET | ORAL | Status: DC | PRN

## 2018-12-23 MED ORDER — SODIUM CHLORIDE (PF) 0.9 % IJ SOLN
INTRAMUSCULAR | Status: DC | PRN
  Administered 2018-12-23: 12 mL/h via EPIDURAL

## 2018-12-23 MED ORDER — BENZOCAINE-MENTHOL 20-0.5 % EX AERO: 1.0000 "application " | INHALATION_SPRAY | CUTANEOUS | Status: DC | PRN

## 2018-12-23 MED ORDER — DIPHENHYDRAMINE HCL 25 MG PO CAPS: 25.0000 mg | ORAL_CAPSULE | Freq: Four times a day (QID) | ORAL | Status: DC | PRN

## 2018-12-23 MED ORDER — LIDOCAINE HCL (PF) 1 % IJ SOLN
INTRAMUSCULAR | Status: DC | PRN
  Administered 2018-12-23: 4 mL via EPIDURAL
  Administered 2018-12-23: 7 mL via EPIDURAL

## 2018-12-23 MED ORDER — SIMETHICONE 80 MG PO CHEW: 80.0000 mg | CHEWABLE_TABLET | ORAL | Status: DC | PRN

## 2018-12-23 NOTE — Anesthesia Postprocedure Evaluation (Signed)
Anesthesia Post Note  Patient: Madeline Garcia  Procedure(s) Performed: AN AD HOC LABOR EPIDURAL     Patient location during evaluation: Mother Baby Anesthesia Type: Epidural Level of consciousness: awake and alert Pain management: pain level controlled Vital Signs Assessment: post-procedure vital signs reviewed and stable Respiratory status: spontaneous breathing, nonlabored ventilation and respiratory function stable Cardiovascular status: stable Postop Assessment: no headache, no backache and epidural receding Anesthetic complications: no    Last Vitals:  Vitals:   12/23/18 1539 12/23/18 1710  BP: 126/88 126/79  Pulse: (!) 104 91  Resp: 20 20  Temp: 37 C   SpO2: 100%     Last Pain:  Vitals:   12/23/18 1539  TempSrc: Oral  PainSc:    Pain Goal:                   [name]

## 2018-12-23 NOTE — Lactation Note (Signed)
This note was copied from a baby's chart. Lactation Consultation Note  Patient Name: Madeline Garcia [id number] Date: 12/23/2018 Reason for consult: Primapara;1st time breastfeeding;Term  Baby is 63 hours old Per mom baby last fed around 4:30 for 10 mins  LC offered to assist to latch, checked diaper it was dry.  Baby assisted to latch in the modified laid back on the right breast / and baby fed for 15 mins with several swallows, increased with compressions.  Baby released and still rooting , switched to the right breast / football / baby latched easier and fed for 12 mins with swallows. Able to hand express small drops prior to latch and increased after latch.  Per mom had several breast changes with pregnancy.  Per mom active with [facility].    Maternal Data Has patient been taught Hand Expression?: Yes Does the patient have breastfeeding experience prior to this delivery?: No  Feeding Feeding Type: Breast Fed  LATCH Score Latch: Repeated attempts needed to sustain latch, nipple held in mouth throughout feeding, stimulation needed to elicit sucking reflex.  Audible Swallowing: Spontaneous and intermittent  Type of Nipple: Everted at rest and after stimulation  Comfort (Breast/Nipple): Soft / non-tender  Hold (Positioning): Assistance needed to correctly position infant at breast and maintain latch.  LATCH Score: 8  Interventions Interventions: Breast feeding basics reviewed;Assisted with latch;Skin to skin;Breast massage;Breast compression;Adjust position;Support pillows;Position options  Lactation Tools Discussed/Used WIC Program: Yes   Consult Status Date: 12/24/18 Follow-up type: In-patient    [place] 12/23/2018, 6:46 PM

## 2018-12-23 NOTE — Anesthesia Procedure Notes (Signed)
Epidural Patient location during procedure: OB Start time: 12/23/2018 5:48 AM End time: 12/23/2018 5:52 AM  Staffing Anesthesiologist: Lyn Hollingshead, MD Performed: anesthesiologist   Preanesthetic Checklist Completed: patient identified, site marked, surgical consent, pre-op evaluation, timeout performed, IV checked, risks and benefits discussed and monitors and equipment checked  Epidural Patient position: sitting Prep: site prepped and draped and DuraPrep Patient monitoring: continuous pulse ox and blood pressure Approach: midline Location: L3-L4 Injection technique: LOR air  Needle:  Needle type: Tuohy  Needle gauge: 17 G Needle length: 9 cm and 9 Needle insertion depth: 9 cm Catheter type: closed end flexible Catheter size: 19 Gauge Catheter at skin depth: 15 cm Test dose: negative and Other  Assessment Events: blood not aspirated, injection not painful, no injection resistance, negative IV test and no paresthesia  Additional Notes Reason for block:procedure for pain

## 2018-12-23 NOTE — Anesthesia Preprocedure Evaluation (Signed)
Anesthesia Evaluation  Patient identified by MRN, date of birth, ID band Patient awake    Reviewed: Allergy & Precautions, H&P , NPO status , Patient's Chart, lab work & pertinent test results  Airway Mallampati: II  TM Distance: >3 FB Neck ROM: full    Dental no notable dental hx. (+) Teeth Intact   Pulmonary neg pulmonary ROS,    Pulmonary exam normal breath sounds clear to auscultation       Cardiovascular negative cardio ROS Normal cardiovascular exam Rhythm:regular Rate:Normal     Neuro/Psych negative neurological ROS  negative psych ROS   GI/Hepatic negative GI ROS, Neg liver ROS,   Endo/Other  Morbid obesity  Renal/GU negative Renal ROS     Musculoskeletal   Abdominal (+) + obese,   Peds  Hematology  (+) Blood dyscrasia, anemia ,   Anesthesia Other Findings   Reproductive/Obstetrics (+) Pregnancy                             Anesthesia Physical Anesthesia Plan  ASA: III  Anesthesia Plan: Epidural   Post-op Pain Management:    Induction:   PONV Risk Score and Plan:   Airway Management Planned:   Additional Equipment:   Intra-op Plan:   Post-operative Plan:   Informed Consent: I have reviewed the patients History and Physical, chart, labs and discussed the procedure including the risks, benefits and alternatives for the proposed anesthesia with the patient or authorized representative who has indicated his/her understanding and acceptance.       Plan Discussed with:   Anesthesia Plan Comments:         Anesthesia Quick Evaluation

## 2018-12-23 NOTE — Progress Notes (Signed)
LABOR PROGRESS NOTE  Madeline Garcia is a 23 y.o. G1P0 at [redacted]w[redacted]d  admitted for PD IOL  Subjective: Feeling a little tired  Objective: BP (!) 136/91   Pulse 97   Temp 99 F (37.2 C) (Oral)   Resp 20   LMP 03/10/2018 (Approximate)   SpO2 99%  or  Vitals:   12/23/18 0735 12/23/18 0801 12/23/18 0830 12/23/18 0831  BP:  133/80 (!) 136/91 (!) 136/91  Pulse:  (!) 102 97 97  Resp:      Temp:    99 F (37.2 C)  TempSrc:    Oral  SpO2: 99%        Dilation: 6.5 Effacement (%): 80 Cervical Position: Anterior Station: -1 Presentation: Vertex Exam by:: Wendall Papa FHT: baseline rate 130, moderate varibility, no acel, repetitive deep variable decel Toco: q2-3 min  Labs: Lab Results  Component Value Date   WBC 14.5 (H) 12/23/2018   HGB 11.2 (L) 12/23/2018   HCT 33.7 (L) 12/23/2018   MCV 77.8 (L) 12/23/2018   PLT 341 12/23/2018    Patient Active Problem List   Diagnosis Date Noted  . Encounter for induction of labor 12/22/2018  . Group B streptococcal carriage complicating pregnancy 40/81/4481  . Anemia affecting pregnancy in third trimester 09/24/2018  . Obesity affecting pregnancy in second trimester 06/25/2018  . Supervision of low-risk first pregnancy 06/10/2018    Assessment / Plan: 23 y.o. G1P0 at [redacted]w[redacted]d here for PD IOL  Labor: IUPC placed for repetitive deep variables, start amnioinfusion/position changes Fetal Wellbeing:  Cat II, monitor for improvement w IUPC Pain Control:  epidural Anticipated MOD:  SVD  Clarnce Flock, MD/[facility] OB Fellow  12/23/2018, 8:47 AM

## 2018-12-23 NOTE — Progress Notes (Signed)
Labor Progress Note Madeline Garcia is a 23 y.o. G1P0 at [redacted]w[redacted]d presented for IOL for post dates.  S: Much milder contractions now.  Pain well controlled with fentanyl.    O:  BP 136/86   Pulse 91   Temp 98.8 F (37.1 C) (Oral)   Resp 18   LMP 03/10/2018 (Approximate)  EFM: 145/moderate var/positive accels/occasional variables (not for the past 2 hours)  CVE: Dilation: 3.5 Effacement (%): 60 Cervical Position: Posterior Station: -3 Presentation: Vertex Exam by:: Dr Pilar Plate   A&P: 23 y.o. G1P0 [redacted]w[redacted]d here for IOL for post dates.  Induction now close to 24 hours with only mild changes.   #Labor: mild progress from previous check (increased dilation, no change in station, no change in effacement. Will plan for epidural between now and next cervical check and possible IUPC placement at next check. Continue pit. #Pain: Epidural soon #FWB: Category II #GBS positive penicillin  #HTN: well controlled, preeclampsia labs negatice   Madeline Haymaker, MD 4:23 AM

## 2018-12-23 NOTE — Progress Notes (Signed)
LABOR PROGRESS NOTE  Madeline Garcia is a 23 y.o. G1P0 at [redacted]w[redacted]d  admitted for PD IOL  Subjective: Feeling lots of rectal pressure  Objective: BP 133/89   Pulse (!) 118   Temp 99.5 F (37.5 C) (Oral)   Resp 20   LMP 03/10/2018 (Approximate)   SpO2 99%  or  Vitals:   12/23/18 0901 12/23/18 0931 12/23/18 1002 12/23/18 1031  BP: 135/86 105/60 120/65 133/89  Pulse: 96 (!) 105 (!) 103 (!) 118  Resp:   20 20  Temp:   99.5 F (37.5 C)   TempSrc:   Oral   SpO2:         Dilation: 10 Dilation Complete Date: 12/23/18 Dilation Complete Time: 1003 Effacement (%): 80 Cervical Position: Anterior Station: Plus 2 Presentation: Vertex Exam by:: Harryette Shuart FHT: baseline rate 130, moderate varibility, no acel, variable decels with pushing Toco: q2-3 min  Labs: Lab Results  Component Value Date   WBC 14.5 (H) 12/23/2018   HGB 11.2 (L) 12/23/2018   HCT 33.7 (L) 12/23/2018   MCV 77.8 (L) 12/23/2018   PLT 341 12/23/2018    Patient Active Problem List   Diagnosis Date Noted  . Encounter for induction of labor 12/22/2018  . Group B streptococcal carriage complicating pregnancy 02/58/5277  . Anemia affecting pregnancy in third trimester 09/24/2018  . Obesity affecting pregnancy in second trimester 06/25/2018  . Supervision of low-risk first pregnancy 06/10/2018    Assessment / Plan: 23 y.o. G1P0 at [redacted]w[redacted]d here for PD IOL and gHTN.  Labor: fully and pushing since ~1015, making good progress with pushes Fetal Wellbeing:  Normal baseline with mod variability reassuring though having variables with pushing, monitor closely for loss of variability Pain Control:  epidural Anticipated MOD:  SVD  Clarnce Flock, MD/[facility] OB Fellow  12/23/2018, 10:46 AM

## 2018-12-23 NOTE — Discharge Summary (Signed)
Postpartum Discharge Summary     Patient Name: Madeline Garcia DOB: [birth year] MRN: [id number]  Date of admission: 12/22/2018 Delivering Provider: Levie HeritageSTINSON, JACOB J   Date of discharge: 12/25/2018  Admitting diagnosis: pregnancy Intrauterine pregnancy: 412w1d     Secondary diagnosis:  Active Problems:   Encounter for induction of labor  Additional problems:  gHTN VAVD Post dates pregnancy     Discharge diagnosis: Term Pregnancy Delivered and Gestational Hypertension                                                                                                Post partum procedures:N/A  Augmentation: Pitocin, Cytotec and Foley Balloon  Complications: None  Hospital course:  Induction of Labor With Vaginal Delivery   23 y.o. yo G1P0 at [redacted]w[redacted]d was admitted to the hospital 12/22/2018 for induction of labor.  Indication for induction: Postdates.  Patient had an uncomplicated labor course as follows: Membrane Rupture Time/Date: 6:00 AM ,12/23/2018   Intrapartum Procedures: Episiotomy: None [1]                                         Lacerations:  None [1]  Patient had delivery of a Viable infant via VAVD.  Information for the patient's newborn:  Gerri LinsHairston, Girl Britnie [[id number]][[id number]]  Delivery Method: Vag-Spont    12/23/2018  Details of delivery can be found in separate delivery note.  Patient had a routine postpartum course. Patient is discharged home 12/25/18.  Magnesium Sulfate recieved: No BMZ received: No  Physical exam  Vitals:   12/24/18 0300 12/24/18 0650 12/24/18 2151 12/25/18 0613  BP: 132/84 126/84 (!) 134/94 117/73  Pulse: 87 81 92 89  Resp: 18 17 20 18   Temp: 99 F (37.2 C) 98.5 F (36.9 C) 97.9 F (36.6 C) 98.5 F (36.9 C)  TempSrc: Oral Oral Oral Oral  SpO2: 100% 100% 100% 100%  Weight:      Height:       General: alert, cooperative and no distress Lochia: appropriate Uterine Fundus: firm Incision: N/A DVT Evaluation: No evidence of DVT seen on  physical exam. No cords or calf tenderness. No significant calf/ankle edema. Labs: Lab Results  Component Value Date   WBC 14.5 (H) 12/23/2018   HGB 11.2 (L) 12/23/2018   HCT 33.7 (L) 12/23/2018   MCV 77.8 (L) 12/23/2018   PLT 341 12/23/2018   CMP Latest Ref Rng & Units 12/22/2018  Glucose 70 - 99 mg/dL 469(G120(H)  BUN 6 - 20 mg/dL <2(X<5(L)  Creatinine 5.280.44 - 1.00 mg/dL 4.130.70  Sodium 244135 - 010145 mmol/L 132(L)  Potassium 3.5 - 5.1 mmol/L 3.5  Chloride 98 - 111 mmol/L 102  CO2 22 - 32 mmol/L 20(L)  Calcium 8.9 - 10.3 mg/dL 9.0  Total Protein 6.5 - 8.1 g/dL 7.3  Total Bilirubin 0.3 - 1.2 mg/dL 0.5  Alkaline Phos 38 - 126 U/L 192(H)  AST 15 - 41 U/L 17  ALT 0 - 44 U/L 12    Discharge instruction:  per After Visit Summary and "Baby and Me Booklet".  After visit meds:  Allergies as of 12/25/2018   No Known Allergies     Medication List    STOP taking these medications   promethazine 25 MG tablet Commonly known as: PHENERGAN     TAKE these medications   acetaminophen 325 MG tablet Commonly known as: TYLENOL Take 650 mg by mouth every 6 (six) hours as needed.   docusate sodium 100 MG capsule Commonly known as: COLACE Take 1 capsule (100 mg total) by mouth 2 (two) times daily.   ferrous sulfate 325 (65 FE) MG tablet Take 1 tablet (325 mg total) by mouth daily with breakfast.   ibuprofen 600 MG tablet Commonly known as: ADVIL Take 1 tablet (600 mg total) by mouth every 6 (six) hours.   prenatal multivitamin Tabs tablet Take 1 tablet by mouth daily at 12 noon.       Diet: routine diet  Activity: Advance as tolerated. Pelvic rest for 6 weeks.   Outpatient follow up:1 week BP check, 4 weeks PP Follow up Appt: Future Appointments  Date Time Provider [place]  12/31/2018  9:00 AM [place]  01/21/2019  9:35 AM Hillard Danker Myles Rosenthal, PA-C [facility]   Follow up Visit: [place] [facility] Follow up.    Specialty: Obstetrics and Gynecology Why: 1 week BP check 4 weeks for routine postpartum visit  Contact information: [address] [place], [place] [id number] [place] [id number] [phone]           Please schedule this patient for Postpartum visit in: 1 week with the following provider: MD For C/S patients schedule nurse incision check in weeks 2 weeks: no High risk pregnancy complicated by: gHTN Delivery mode:  Vacuum Anticipated Birth Control:  Depo PP Procedures needed: BP check  Schedule Integrated Johnson visit: no     Newborn Data: Live born female  Birth Weight: 7 lb 15.3 oz (3610 g) APGAR: 7, 9  Newborn Delivery   Birth date/time: 12/23/2018 11:32:00 Delivery type: Vaginal, Vacuum (Extractor)      Baby Feeding: Bottle and Breast Disposition:home with mother   12/25/2018 Kerry Hough, PA-C

## 2018-12-24 ENCOUNTER — Encounter (HOSPITAL_COMMUNITY): Payer: Self-pay

## 2018-12-24 NOTE — Progress Notes (Signed)
Post Partum Day 1 Subjective: no complaints, up ad lib, voiding and tolerating PO  Objective: Blood pressure 126/84, pulse 81, temperature 98.5 F (36.9 C), temperature source Oral, resp. rate 17, height 5\' 4"  (1.626 m), weight 117.9 kg, last menstrual period 03/10/2018, SpO2 100 %, unknown if currently breastfeeding.  Physical Exam:  General: alert, cooperative and no distress Lochia: appropriate Uterine Fundus: firm Incision: healing well, no significant drainage DVT Evaluation: No evidence of DVT seen on physical exam.  Recent Labs    12/22/18 0737 12/23/18 0010  HGB 10.8* 11.2*  HCT 33.5* 33.7*    Assessment/Plan: Plan for discharge tomorrow and Breastfeeding   LOS: 2 days   Hansel Feinstein 12/24/2018, 7:40 AM

## 2018-12-24 NOTE — Lactation Note (Signed)
This note was copied from a baby's chart. Lactation Consultation Note  Patient Name: Madeline Garcia [id number] Date: 12/24/2018 Reason for consult: Follow-up assessment   P1, Baby 52 hours old and per mother baby has been sleepy today. Baby was sucking on a pacifier upon entering. Pacifier use not recommended at this time. Provided education. Mother hand expressed drops. Assisted with latching baby in both cross cradle and football hold with pillows for support.  Had mother sandwich breast to achieve depth.  Baby sustained latch in both positions for a total of 25 min. Baby still seems to have disorganized suck checked with LC gloved finger. Left baby STS on mother's chest. Suggest if baby continues to cue, relatch baby.   DEBP is set up in room and RN brought in formula and syringe. Encouraged mother to breastfeed on demand at least 8-12 times per day.      Maternal Data Has patient been taught Hand Expression?: Yes Does the patient have breastfeeding experience prior to this delivery?: No  Feeding Feeding Type: Breast Fed  LATCH Score Latch: Repeated attempts needed to sustain latch, nipple held in mouth throughout feeding, stimulation needed to elicit sucking reflex.  Audible Swallowing: A few with stimulation  Type of Nipple: Everted at rest and after stimulation  Comfort (Breast/Nipple): Soft / non-tender  Hold (Positioning): Assistance needed to correctly position infant at breast and maintain latch.  LATCH Score: 7  Interventions Interventions: Breast feeding basics reviewed;Assisted with latch;Breast massage;Hand express;Breast compression;Support pillows;Position options;DEBP  Lactation Tools Discussed/Used     Consult Status Consult Status: Follow-up Date: 12/25/18 Follow-up type: In-patient    Vivianne Master [facility] 12/24/2018, 6:51 PM

## 2018-12-25 MED ORDER — IBUPROFEN 600 MG PO TABS: 600.0000 mg | ORAL_TABLET | Freq: Four times a day (QID) | ORAL | 0 refills | Status: DC

## 2018-12-25 NOTE — Lactation Note (Signed)
This note was copied from a baby's chart. Lactation Consultation Note: Mother reports that she is breast and bottle feeding. She reports that infant just latches on for a few sucks and then off again.  Teaching infant importance of offering infant breast with all feeding cues. Mother suggested that she breastfeed infant before giving a bottle. Mother reports that she just pumped once with the electric pump but she didn't get any .  Reviewed hand expression with mother. Mother is able to hand express good flow of colostrum. Mother to page [facility] for next feeding.   Patient Name: Madeline Garcia [id number] Date: 12/25/2018 Reason for consult: Follow-up assessment   Maternal Data    Feeding Feeding Type: Bottle Fed - Formula  LATCH Score                   Interventions Interventions: Hand express;Hand pump  Lactation Tools Discussed/Used     Consult Status Consult Status: Complete    Darla Lesches 12/25/2018, 3:21 PM

## 2018-12-25 NOTE — Lactation Note (Signed)
This note was copied from a baby's chart. Lactation Consultation Note: Mother attempting to latch infant on in football hold. Infant had just been fed 25 ml of formula less than 3 hours ago.  Infant took a few sucks and spit formula.   Lots of teaching with mother on proper latch and support,  Mother informed to do good breast massage and ice breast every 3-4 hours .  Advised parents not to use or limit pacifier. Mother to cue base feed and feed infant 8-12 or more times in 24 hours. Discussed cluster feeding.   Mother is active with [facility]. Suggested that mother take all pump parts home. She was given a harmony hand pump with instructions.   Mother to pump for 15 mins on each breast. Advised mother to get a nursing bra for good support.  Mother receptive to all teaching.   Patient Name: Madeline Garcia [id number] Date: 12/25/2018 Reason for consult: Follow-up assessment   Maternal Data    Feeding Feeding Type: Bottle Fed - Formula  LATCH Score                   Interventions Interventions: Hand express;Hand pump  Lactation Tools Discussed/Used     Consult Status Consult Status: Complete    Darla Lesches 12/25/2018, 3:14 PM

## 2018-12-25 NOTE — Discharge Instructions (Signed)

## 2018-12-26 ENCOUNTER — Ambulatory Visit: Payer: Self-pay

## 2018-12-26 NOTE — Lactation Note (Signed)
This note was copied from a baby's chart. Lactation Consultation Note  Patient Name: Madeline Garcia [id number] Date: 12/26/2018 Reason for consult: Follow-up assessment;Term;Primapara;1st time breastfeeding  P1 mother whose infant is now 73 hours old.  Mother's feeding preference on admission was breast/bottle.  Mother had no questions/concerns related to breast feeding and stated, "she did well this morning."  Encouraged to continue feeding with cues and to always offer the breast prior to giving any supplementation.  Mother verbalized understanding.  Mother's breasts are soft and non tender and nipples are everted and intact.  Engorgement prevention/treatment reviewed.  Manual pump at bedside.  Mother is planning on obtaining a DEBP from the [facility] office after discharge.   She has our OP phone number for questions after discharge.  Father present.   Maternal Data Formula Feeding for Exclusion: Yes Reason for exclusion: Mother's choice to formula and breast feed on admission Has patient been taught Hand Expression?: Yes Does the patient have breastfeeding experience prior to this delivery?: No  Feeding Feeding Type: Breast Fed  LATCH Score                   Interventions    Lactation Tools Discussed/Used WIC Program: Yes   Consult Status Consult Status: Complete Date: 12/26/18 Follow-up type: Call as needed    Lanice Schwab Daviel Allegretto 12/26/2018, 8:46 AM

## 2018-12-30 ENCOUNTER — Telehealth: Payer: Self-pay | Admitting: Family Medicine

## 2018-12-30 NOTE — Telephone Encounter (Signed)
Spoke to patient about her appointment on 8/26 @ 9:00. Patient instructed to wear a face mask for the entire appointment and no visitors are allowed with her during the visit. Patient screened for covid symptoms and denied having any

## 2018-12-31 ENCOUNTER — Other Ambulatory Visit: Payer: Self-pay

## 2018-12-31 ENCOUNTER — Ambulatory Visit (INDEPENDENT_AMBULATORY_CARE_PROVIDER_SITE_OTHER): Payer: Medicaid Other | Admitting: Lactation Services

## 2018-12-31 DIAGNOSIS — Z3400 Encounter for supervision of normal first pregnancy, unspecified trimester: Secondary | ICD-10-CM

## 2018-12-31 NOTE — Progress Notes (Signed)
Pt in office post delivery on 8/18. Pt denies HA, Dizziness, or blurred vision.   Pt reports she is BF and formula feeding infant, she reports it is going well.   Pt reports she has no questions or concerns at this time.   Reviewed with pt that is she experiences HA, Blurred vision or dizziness to sit and take her BP and report symptoms to the office. Pt voiced understanding.

## 2019-01-20 ENCOUNTER — Telehealth: Payer: Self-pay | Admitting: Obstetrics & Gynecology

## 2019-01-20 NOTE — Telephone Encounter (Signed)
Called the patient to inform of the upcoming mychart visit. Informed the patient of expecting a phone call from our office with information of when to log in. The patient verbalized understanding. °

## 2019-01-21 ENCOUNTER — Encounter: Payer: Self-pay | Admitting: Medical

## 2019-01-21 ENCOUNTER — Telehealth (INDEPENDENT_AMBULATORY_CARE_PROVIDER_SITE_OTHER): Payer: Medicaid Other | Admitting: Medical

## 2019-01-21 ENCOUNTER — Other Ambulatory Visit: Payer: Self-pay

## 2019-01-21 DIAGNOSIS — Z1389 Encounter for screening for other disorder: Secondary | ICD-10-CM | POA: Diagnosis not present

## 2019-01-21 NOTE — Patient Instructions (Signed)
Medroxyprogesterone injection [Contraceptive] What is this medicine? MEDROXYPROGESTERONE (me DROX ee proe JES te rone) contraceptive injections prevent pregnancy. They provide effective birth control for 3 months. Depo-subQ Provera 104 is also used for treating pain related to endometriosis. This medicine may be used for other purposes; ask your health care provider or pharmacist if you have questions. COMMON BRAND NAME(S): Depo-Provera, Depo-subQ Provera 104 What should I tell my health care provider before I take this medicine? They need to know if you have any of these conditions:  frequently drink alcohol  asthma  blood vessel disease or a history of a blood clot in the lungs or legs  bone disease such as osteoporosis  breast cancer  diabetes  eating disorder (anorexia nervosa or bulimia)  high blood pressure  HIV infection or AIDS  kidney disease  liver disease  mental depression  migraine  seizures (convulsions)  stroke  tobacco smoker  vaginal bleeding  an unusual or allergic reaction to medroxyprogesterone, other hormones, medicines, foods, dyes, or preservatives  pregnant or trying to get pregnant  breast-feeding How should I use this medicine? Depo-Provera Contraceptive injection is given into a muscle. Depo-subQ Provera 104 injection is given under the skin. These injections are given by a health care professional. You must not be pregnant before getting an injection. The injection is usually given during the first 5 days after the start of a menstrual period or 6 weeks after delivery of a baby. Talk to your pediatrician regarding the use of this medicine in children. Special care may be needed. These injections have been used in female children who have started having menstrual periods. Overdosage: If you think you have taken too much of this medicine contact a poison control center or emergency room at once. NOTE: This medicine is only for you. Do not  share this medicine with others. What if I miss a dose? Try not to miss a dose. You must get an injection once every 3 months to maintain birth control. If you cannot keep an appointment, call and reschedule it. If you wait longer than 13 weeks between Depo-Provera contraceptive injections or longer than 14 weeks between Depo-subQ Provera 104 injections, you could get pregnant. Use another method for birth control if you miss your appointment. You may also need a pregnancy test before receiving another injection. What may interact with this medicine? Do not take this medicine with any of the following medications:  bosentan This medicine may also interact with the following medications:  aminoglutethimide  antibiotics or medicines for infections, especially rifampin, rifabutin, rifapentine, and griseofulvin  aprepitant  barbiturate medicines such as phenobarbital or primidone  bexarotene  carbamazepine  medicines for seizures like ethotoin, felbamate, oxcarbazepine, phenytoin, topiramate  modafinil  St. John's wort This list may not describe all possible interactions. Give your health care provider a list of all the medicines, herbs, non-prescription drugs, or dietary supplements you use. Also tell them if you smoke, drink alcohol, or use illegal drugs. Some items may interact with your medicine. What should I watch for while using this medicine? This drug does not protect you against HIV infection (AIDS) or other sexually transmitted diseases. Use of this product may cause you to lose calcium from your bones. Loss of calcium may cause weak bones (osteoporosis). Only use this product for more than 2 years if other forms of birth control are not right for you. The longer you use this product for birth control the more likely you will be at risk   for weak bones. Ask your health care professional how you can keep strong bones. You may have a change in bleeding pattern or irregular periods.  Many females stop having periods while taking this drug. If you have received your injections on time, your chance of being pregnant is very low. If you think you may be pregnant, see your health care professional as soon as possible. Tell your health care professional if you want to get pregnant within the next year. The effect of this medicine may last a long time after you get your last injection. What side effects may I notice from receiving this medicine? Side effects that you should report to your doctor or health care professional as soon as possible:  allergic reactions like skin rash, itching or hives, swelling of the face, lips, or tongue  breast tenderness or discharge  breathing problems  changes in vision  depression  feeling faint or lightheaded, falls  fever  pain in the abdomen, chest, groin, or leg  problems with balance, talking, walking  unusually weak or tired  yellowing of the eyes or skin Side effects that usually do not require medical attention (report to your doctor or health care professional if they continue or are bothersome):  acne  fluid retention and swelling  headache  irregular periods, spotting, or absent periods  temporary pain, itching, or skin reaction at site where injected  weight gain This list may not describe all possible side effects. Call your doctor for medical advice about side effects. You may report side effects to FDA at 1-800-FDA-1088. Where should I keep my medicine? This does not apply. The injection will be given to you by a health care professional. NOTE: This sheet is a summary. It may not cover all possible information. If you have questions about this medicine, talk to your doctor, pharmacist, or health care provider.  2020 Elsevier/Gold Standard (2008-05-14 18:37:56)  

## 2019-01-21 NOTE — Progress Notes (Signed)
Pt states that she wants Depo for [facility].

## 2019-01-21 NOTE — Progress Notes (Signed)
I connected with Madeline Garcia on 08/12/18 at  9:15 AM EDT by: MyChart and verified that I am speaking with the correct person using two identifiers.  Patient is located at home and provider is located at [facility].     The purpose of this virtual visit is to provide medical care while limiting exposure to the novel coronavirus. I discussed the limitations, risks, security and privacy concerns of performing an evaluation and management service by MyChart and the availability of in person appointments. I also discussed with the patient that there may be a patient responsible charge related to this service. By engaging in this virtual visit, you consent to the provision of healthcare.  Additionally, you authorize for your insurance to be billed for the services provided during this visit.  The patient expressed understanding and agreed to proceed.  Subjective:     Madeline Garcia is a 23 y.o. female who presents for a postpartum visit. She is 4 weeks postpartum following a spontaneous vaginal delivery. I have fully reviewed the prenatal and intrapartum course. The delivery was at 6/1 gestational weeks. Outcome: spontaneous vaginal delivery. Anesthesia: epidural. Postpartum course has been normal. Baby's course has been normal. Baby is feeding by both breast and bottle - [employer] Good Start. Bleeding no bleeding. Bowel function is normal. Bladder function is normal. Patient is sexually active. Contraception method is condoms. Postpartum depression screening: negative.  The following portions of the patient's history were reviewed and updated as appropriate: allergies, current medications, past family history, past medical history, past social history, past surgical history and problem list.  Review of Systems Pertinent items are noted in HPI.   Objective:    Breastfeeding No   General:  alert and cooperative   Assessment:     Normal postpartum exam. Pap smear not done at today's visit.   Normal pap 06/2018. Next pap smear is due 06/2021.   Plan:    1. Contraception: condoms 2. Patient plans to start Depo provera and will come to the office for a nurse visit for that as soon as possible  3. Follow up in: a few days for Depo Provera injection or sooner as needed.  Otherwise patient can follow-up in 1 year for annual exam.   Time spent with non-face-to-face contact: 10 minutes   Luvenia Redden, PA-C 01/21/19 9:30 AM

## 2019-02-03 ENCOUNTER — Telehealth: Payer: Self-pay | Admitting: Family Medicine

## 2019-02-03 NOTE — Telephone Encounter (Signed)
Spoke to patient about her appointment on 9/30 @ 10:15. Patient instructed to wear a face mask for the entire appointment and no visitors are allowed with her during the visit. Patient screened for covid symptoms and denied having any

## 2019-02-04 ENCOUNTER — Ambulatory Visit (INDEPENDENT_AMBULATORY_CARE_PROVIDER_SITE_OTHER): Payer: Medicaid Other | Admitting: Family Medicine

## 2019-02-04 DIAGNOSIS — B360 Pityriasis versicolor: Secondary | ICD-10-CM

## 2019-02-04 DIAGNOSIS — Z3009 Encounter for other general counseling and advice on contraception: Secondary | ICD-10-CM | POA: Diagnosis not present

## 2019-02-04 MED ORDER — CICLOPIROX 0.77 % EX GEL: 1.0000 | Freq: Two times a day (BID) | CUTANEOUS | 1 refills | Status: AC

## 2019-02-04 NOTE — Progress Notes (Signed)
    ENCOUNTER NOTE  Subjective:   Madeline Garcia is a 23 y.o. G75P1001 female here for a skin check/Depo shot. She was supposed to get her Depo shot today, but has been having intercourse (she reports that the condoms have been breaking. Current complaints: she continues to have a skin rash on the periphery of her face and it has spread down to her chest. She reports that it does not itch, and she had it while she was pregnant. It does not itch or burn, but it is causing her distress because of the skin color changes being in highly visible areas.     Review of Systems Pertinent items are noted in HPI.   Objective:  BP 123/82   Pulse 91   Temp 98.7 F (37.1 C)   Wt 256 lb (116.1 kg)   BMI 43.94 kg/m  CONSTITUTIONAL: Well-developed, well-nourished female in no acute distress.  HENT:  Normocephalic, atraumatic, External right and left ear normal. Oropharynx is clear and moist EYES: Conjunctivae and EOM are normal. Pupils are equal, round, and reactive to light. No scleral icterus.  NECK: Normal range of motion, supple, no masses.  Normal thyroid.  SKIN: Small, circular, hypopigmented macules less than 1 cm diameter with well-demarcated borders located around the periphery of the face, along the jaw, and down the neck to the anterior chest. No scaling, erythema, or drainage noted. CARDIOVASCULAR: Normal heart rate noted, regular rhythm RESPIRATORY: Clear to auscultation bilaterally. Effort and breath sounds normal, no problems with respiration noted.  Assessment and Plan:   1. Tinea versicolor -Start Ciclopirox 0.77% gel BID to face and anterior chest -Skin check in 2 weeks, if no improvement, refer to Dermatology  2. Birth control counseling -Unable to perform Depo shot today due to recent intercourse with possible pregnancy -Counseled on abstinence for two weeks -RTO in 2 weeks for Depo shot   Willaim Mode, Dan Europe, DO OB Fellow, [facility] 02/04/2019 12:17 PM

## 2019-02-04 NOTE — Patient Instructions (Signed)
Tinea Versicolor  Tinea versicolor is a skin infection. It is caused by a type of yeast. It is normal for some yeast to be on your skin, but too much yeast causes this infection. The infection causes a rash of light or dark patches on your skin. The rash is most common on the chest, back, neck, or upper arms. The infection usually does not cause other problems. If it is treated, it will probably go away in a few weeks. The infection cannot be spread from one person to another (is not contagious). Follow these instructions at home:  Use over-the-counter and prescription medicines only as told by your doctor.  Scrub your skin every day with dandruff shampoo as told by your doctor.  Do not scratch your skin in the rash area.  Avoid places that are hot and humid.  Do not use tanning booths.  Try to avoid sweating a lot. Contact a doctor if:  Your symptoms get worse.  You have a fever.  You have redness, swelling, or pain in the rash area.  You have fluid or blood coming from your rash.  Your rash feels warm to the touch.  You have pus or a bad smell coming from your rash.  Your rash comes back (recurs) after treatment. Summary  Tinea versicolor is a skin infection. It causes a rash of light or dark patches on your skin.  The rash is most common on the chest, back, neck, or upper arms. This infection usually does not cause other problems.  Use over-the-counter and prescription medicines only as told by your doctor.  If the infection is treated, it will probably go away in a few weeks. This information is not intended to replace advice given to you by your health care provider. Make sure you discuss any questions you have with your health care provider. Document Released: 04/05/2008 Document Revised: 04/05/2017 Document Reviewed: 12/24/2016 Elsevier Patient Education  2020 Elsevier Inc.  

## 2019-02-17 ENCOUNTER — Other Ambulatory Visit: Payer: Self-pay

## 2019-02-17 ENCOUNTER — Ambulatory Visit (INDEPENDENT_AMBULATORY_CARE_PROVIDER_SITE_OTHER): Payer: Medicaid Other | Admitting: Emergency Medicine

## 2019-02-17 DIAGNOSIS — B360 Pityriasis versicolor: Secondary | ICD-10-CM

## 2019-02-17 DIAGNOSIS — Z3042 Encounter for surveillance of injectable contraceptive: Secondary | ICD-10-CM | POA: Diagnosis present

## 2019-02-17 LAB — POCT PREGNANCY, URINE: Preg Test, Ur: NEGATIVE

## 2019-02-17 MED ORDER — MEDROXYPROGESTERONE ACETATE 150 MG/ML IM SUSP
150.0000 mg | Freq: Once | INTRAMUSCULAR | Status: AC
  Administered 2019-02-17: 11:00:00 150 mg via INTRAMUSCULAR

## 2019-02-17 NOTE — Progress Notes (Signed)
Chart reviewed for nurse visit. Agree with plan of care.   Starr Lake, [place] 02/17/2019 2:09 PM

## 2019-02-17 NOTE — Progress Notes (Signed)
Madeline Garcia here for Depo-Provera  Injection.  Injection administered without complication. Patient will return in 3 months for next injection.  Loma Sousa, RN 02/17/19 1103

## 2019-02-18 MED ORDER — KETOCONAZOLE 2 % EX CREA: TOPICAL_CREAM | Freq: Every day | CUTANEOUS | Status: DC

## 2019-02-18 NOTE — Telephone Encounter (Signed)
Sent script for ketoconazole 2% cream

## 2019-02-19 ENCOUNTER — Telehealth: Payer: Self-pay | Admitting: Lactation Services

## 2019-02-19 MED ORDER — KETOCONAZOLE 2 % EX CREA: 1.0000 "application " | TOPICAL_CREAM | Freq: Every day | CUTANEOUS | 0 refills | Status: DC

## 2019-02-19 MED ORDER — KETOCONAZOLE 2 % EX CREA: TOPICAL_CREAM | Freq: Every day | CUTANEOUS | Status: DC

## 2019-02-19 NOTE — Telephone Encounter (Signed)
Called pt to let her now the prescription was ordered for in house and has been sent to her Pharmacy now. LM for pt as she did not answer.

## 2019-03-04 ENCOUNTER — Telehealth: Payer: Self-pay

## 2019-03-04 DIAGNOSIS — B360 Pityriasis versicolor: Secondary | ICD-10-CM

## 2019-03-04 NOTE — Telephone Encounter (Signed)
-----   Message from Shelly Coss, RN sent at 03/02/2019  8:54 AM EDT ----- Regarding: dermatology referral Per Dr Darene Lamer, patient needs dermatology referral.

## 2019-03-04 NOTE — Telephone Encounter (Signed)
Contacted [facility] [place] [facility]. Staff member confirmed they would accept a referral for pt. Referral ordered. Staff member states they will contact pt after receiving referral.  Attempted to contact pt. VM left stating a referral has been made to another office and pt may check her MyChart for further details.   MyChart message sent. Message sent to front office regarding referral.

## 2019-03-06 NOTE — Telephone Encounter (Signed)
Thanks

## 2019-03-12 ENCOUNTER — Telehealth: Payer: Self-pay

## 2019-03-12 NOTE — Telephone Encounter (Signed)
Pt called office requesting information about previous Dermatology referral. Pt states she contacted office she was referred to and they said they have not received a referral for this patient. Notified pt we will look into this and call her back.  [place] office notified.

## 2019-03-13 ENCOUNTER — Telehealth: Payer: Self-pay

## 2019-03-13 NOTE — Telephone Encounter (Signed)
Called the [facility]). Staff member confirms they have received pt's information and will call today to schedule an appt.   Called pt to notify her dermatologist will be calling today to schedule appt.

## 2019-05-11 ENCOUNTER — Ambulatory Visit: Payer: Self-pay

## 2019-05-20 ENCOUNTER — Ambulatory Visit (INDEPENDENT_AMBULATORY_CARE_PROVIDER_SITE_OTHER): Payer: Medicaid Other

## 2019-05-20 ENCOUNTER — Other Ambulatory Visit: Payer: Self-pay

## 2019-05-20 VITALS — BP 115/72 | HR 93 | Wt 272.8 lb

## 2019-05-20 DIAGNOSIS — Z3042 Encounter for surveillance of injectable contraceptive: Secondary | ICD-10-CM | POA: Diagnosis present

## 2019-05-20 MED ORDER — MEDROXYPROGESTERONE ACETATE 150 MG/ML IM SUSP
150.0000 mg | Freq: Once | INTRAMUSCULAR | Status: AC
  Administered 2019-05-20: 10:00:00 150 mg via INTRAMUSCULAR

## 2019-05-20 NOTE — Progress Notes (Signed)
Agree with A & P. 

## 2019-05-20 NOTE — Progress Notes (Signed)
Jacquelynn Cree here for Depo-Provera  Injection.  Injection administered without complication. Patient will return in 3 months for next injection.  Marjo Bicker, RN 05/20/2019  9:02 AM

## 2019-08-05 ENCOUNTER — Ambulatory Visit (INDEPENDENT_AMBULATORY_CARE_PROVIDER_SITE_OTHER): Payer: Medicaid Other

## 2019-08-05 ENCOUNTER — Other Ambulatory Visit: Payer: Self-pay

## 2019-08-05 VITALS — BP 123/73 | HR 82 | Wt 270.7 lb

## 2019-08-05 DIAGNOSIS — Z3042 Encounter for surveillance of injectable contraceptive: Secondary | ICD-10-CM | POA: Diagnosis not present

## 2019-08-05 MED ORDER — MEDROXYPROGESTERONE ACETATE 150 MG/ML IM SUSP
150.0000 mg | Freq: Once | INTRAMUSCULAR | Status: AC
  Administered 2019-08-05: 12:00:00 150 mg via INTRAMUSCULAR

## 2019-08-05 NOTE — Progress Notes (Signed)
Madeline Garcia here for Depo-Provera  Injection.  Injection administered without complication. Patient will return in 3 months for next injection.  Ralene Bathe, RN 08/05/2019  10:58 AM

## 2019-10-22 ENCOUNTER — Ambulatory Visit: Payer: Medicaid Other

## 2019-10-27 ENCOUNTER — Other Ambulatory Visit: Payer: Self-pay

## 2019-10-27 ENCOUNTER — Ambulatory Visit (INDEPENDENT_AMBULATORY_CARE_PROVIDER_SITE_OTHER): Payer: Medicaid Other

## 2019-10-27 VITALS — BP 134/76 | HR 81 | Wt 275.0 lb

## 2019-10-27 DIAGNOSIS — Z3042 Encounter for surveillance of injectable contraceptive: Secondary | ICD-10-CM

## 2019-10-27 MED ORDER — MEDROXYPROGESTERONE ACETATE 150 MG/ML IM SUSP
150.0000 mg | Freq: Once | INTRAMUSCULAR | Status: AC
  Administered 2019-10-27: 150 mg via INTRAMUSCULAR

## 2019-10-27 NOTE — Progress Notes (Signed)
Madeline Garcia here for Depo-Provera  Injection.  Injection administered without complication. Patient will return in 3 months for next injection.  Ralene Bathe, RN 10/27/2019  9:30 AM

## 2019-12-22 ENCOUNTER — Other Ambulatory Visit: Payer: Self-pay

## 2019-12-22 ENCOUNTER — Encounter (HOSPITAL_COMMUNITY): Payer: Self-pay | Admitting: Emergency Medicine

## 2019-12-22 ENCOUNTER — Ambulatory Visit (HOSPITAL_COMMUNITY)
Admission: EM | Admit: 2019-12-22 | Discharge: 2019-12-22 | Disposition: A | Discharge status: Home / Self Care | Payer: Medicaid Other | Attending: Urgent Care | Admitting: Urgent Care

## 2019-12-22 ENCOUNTER — Ambulatory Visit (INDEPENDENT_AMBULATORY_CARE_PROVIDER_SITE_OTHER): Payer: Medicaid Other

## 2019-12-22 DIAGNOSIS — Z791 Long term (current) use of non-steroidal anti-inflammatories (NSAID): Secondary | ICD-10-CM | POA: Diagnosis not present

## 2019-12-22 DIAGNOSIS — U071 COVID-19: Secondary | ICD-10-CM | POA: Diagnosis not present

## 2019-12-22 DIAGNOSIS — R52 Pain, unspecified: Secondary | ICD-10-CM

## 2019-12-22 DIAGNOSIS — R509 Fever, unspecified: Secondary | ICD-10-CM

## 2019-12-22 DIAGNOSIS — B349 Viral infection, unspecified: Secondary | ICD-10-CM | POA: Diagnosis not present

## 2019-12-22 DIAGNOSIS — R05 Cough: Secondary | ICD-10-CM | POA: Diagnosis not present

## 2019-12-22 DIAGNOSIS — R112 Nausea with vomiting, unspecified: Secondary | ICD-10-CM

## 2019-12-22 DIAGNOSIS — R059 Cough, unspecified: Secondary | ICD-10-CM

## 2019-12-22 LAB — SARS CORONAVIRUS 2 (TAT 6-24 HRS): SARS Coronavirus 2: POSITIVE — AB

## 2019-12-22 MED ORDER — ONDANSETRON 8 MG PO TBDP: 8.0000 mg | ORAL_TABLET | Freq: Three times a day (TID) | ORAL | 0 refills | Status: DC | PRN

## 2019-12-22 MED ORDER — IBUPROFEN 800 MG PO TABS
ORAL_TABLET | ORAL | Status: AC
  Filled 2019-12-22: qty 1

## 2019-12-22 MED ORDER — PROMETHAZINE-DM 6.25-15 MG/5ML PO SYRP: 5.0000 mL | ORAL_SOLUTION | Freq: Every evening | ORAL | 0 refills | Status: DC | PRN

## 2019-12-22 MED ORDER — BENZONATATE 100 MG PO CAPS: 100.0000 mg | ORAL_CAPSULE | Freq: Three times a day (TID) | ORAL | 0 refills | Status: DC | PRN

## 2019-12-22 MED ORDER — IBUPROFEN 800 MG PO TABS
800.0000 mg | ORAL_TABLET | Freq: Once | ORAL | Status: AC
  Administered 2019-12-22: 800 mg via ORAL

## 2019-12-22 MED ORDER — CETIRIZINE HCL 10 MG PO TABS: 10.0000 mg | ORAL_TABLET | Freq: Every day | ORAL | 0 refills | Status: DC

## 2019-12-22 MED ORDER — ACETAMINOPHEN 325 MG PO TABS
ORAL_TABLET | ORAL | Status: AC
  Filled 2019-12-22: qty 2

## 2019-12-22 MED ORDER — PSEUDOEPHEDRINE HCL 60 MG PO TABS: 60.0000 mg | ORAL_TABLET | Freq: Three times a day (TID) | ORAL | 0 refills | Status: DC | PRN

## 2019-12-22 MED ORDER — ACETAMINOPHEN 325 MG PO TABS
650.0000 mg | ORAL_TABLET | Freq: Once | ORAL | Status: AC
  Administered 2019-12-22: 650 mg via ORAL

## 2019-12-22 NOTE — ED Provider Notes (Addendum)
[facility]   MRN: [id number] DOB: 03/01/1996  Subjective:   Madeline Garcia is a 24 y.o. female presenting for 2-week history of persistent bothersome cough.  She is also had a runny nose, chills, body aches, intermittent nausea and occasional vomiting, loose stools.  Denies chest pain, shortness of breath, loss of sense of taste and smell.  Has not had COVID vaccination.  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  Has not used any medications for relief.  Patient reports that she is otherwise very healthy.  No current facility-administered medications for this encounter.  Current Outpatient Medications:  .  ibuprofen (ADVIL) 600 MG tablet, Take 1 tablet (600 mg total) by mouth every 6 (six) hours. (Patient not taking: Reported on 08/05/2019), Disp: 30 tablet, Rfl: 0   No Known Allergies  Past Medical History:  Diagnosis Date  . Medical history non-contributory      Past Surgical History:  Procedure Laterality Date  . NO PAST SURGERIES      Family History  Problem Relation Age of Onset  . Cancer Mother        Breast    Social History   Tobacco Use  . Smoking status: Never Smoker  . Smokeless tobacco: Never Used  Vaping Use  . Vaping Use: Never used  Substance Use Topics  . Alcohol use: Never  . Drug use: Never    ROS   Objective:   Vitals: BP 121/77 (BP Location: Left Arm)   Pulse (!) 118   Temp (!) 102.3 F (39.1 C) (Oral)   Resp 20   SpO2 99%   Temp at d/c was 100.62F, pulse was 103bpm.  Physical Exam Constitutional:      General: She is not in acute distress.    Appearance: Normal appearance. She is well-developed. She is obese. She is ill-appearing. She is not toxic-appearing or diaphoretic.  HENT:     Head: Normocephalic and atraumatic.     Right Ear: External ear normal.     Left Ear: External ear normal.     Nose: Congestion and rhinorrhea present.     Mouth/Throat:     Mouth: Mucous membranes are moist.     Pharynx: No oropharyngeal  exudate or posterior oropharyngeal erythema.  Eyes:     General: No scleral icterus.       Right eye: No discharge.        Left eye: No discharge.     Extraocular Movements: Extraocular movements intact.     Conjunctiva/sclera: Conjunctivae normal.     Pupils: Pupils are equal, round, and reactive to light.  Cardiovascular:     Rate and Rhythm: Normal rate and regular rhythm.     Pulses: Normal pulses.     Heart sounds: Normal heart sounds. No murmur heard.  No friction rub. No gallop.   Pulmonary:     Effort: Pulmonary effort is normal. No respiratory distress.     Breath sounds: Normal breath sounds. No stridor. No wheezing, rhonchi or rales.  Skin:    General: Skin is warm and dry.     Findings: No rash.  Neurological:     Mental Status: She is alert and oriented to person, place, and time.  Psychiatric:        Mood and Affect: Mood normal.        Behavior: Behavior normal.        Thought Content: Thought content normal.        Judgment: Judgment normal.  DG Chest 2 View  Result Date: 12/22/2019 CLINICAL DATA:  Cough.  Fever. EXAM: CHEST - 2 VIEW COMPARISON:  Prior chest report 06/14/2019. FINDINGS: Mediastinum hilar structures normal. Heart size normal. Normal pulmonary vascularity. No focal infiltrate. No pleural effusion or pneumothorax. Mild degenerative change thoracic spine. IMPRESSION: No acute cardiopulmonary disease. Electronically Signed   By: Maisie Fus  Register   On: 12/22/2019 10:10    Assessment and Plan :   PDMP not reviewed this encounter.  1. Viral illness   2. Cough   3. Body aches   4. Fever, unspecified   5. Nausea vomiting and diarrhea     Will manage for viral illness such as viral URI, viral syndrome, viral rhinitis, COVID-19. Counseled patient on nature of COVID-19 including modes of transmission, diagnostic testing, management and supportive care.  Offered scripts for symptomatic relief. COVID 19 testing is pending. If COVID 19 testing is  negative, consider antibiotic course to address sinusitis as the source of her symptoms. Counseled patient on potential for adverse effects with medications prescribed/recommended today, ER and return-to-clinic precautions discussed, patient verbalized understanding.     Wallis Bamberg, PA-C 12/22/19 1035

## 2019-12-22 NOTE — Discharge Instructions (Addendum)

## 2019-12-22 NOTE — ED Triage Notes (Signed)
Pt presents with cough xs 2 weeks. N,v,d, sore throat, runny nose, chills, abdominal pain, body aches xs 1-2 days.   Denies loss of taste or smell, SOB, chest pain.   Denies [place] of OTC medication.

## 2019-12-29 ENCOUNTER — Encounter (HOSPITAL_COMMUNITY): Payer: Self-pay | Admitting: Emergency Medicine

## 2019-12-29 ENCOUNTER — Other Ambulatory Visit: Payer: Self-pay

## 2019-12-29 ENCOUNTER — Emergency Department (HOSPITAL_COMMUNITY): Payer: Medicaid Other

## 2019-12-29 ENCOUNTER — Emergency Department (HOSPITAL_COMMUNITY)
Admission: EM | Admit: 2019-12-29 | Discharge: 2019-12-30 | Disposition: A | Discharge status: Home / Self Care | Payer: Medicaid Other | Attending: Emergency Medicine | Admitting: Emergency Medicine

## 2019-12-29 DIAGNOSIS — U071 COVID-19: Secondary | ICD-10-CM | POA: Diagnosis not present

## 2019-12-29 DIAGNOSIS — R05 Cough: Secondary | ICD-10-CM | POA: Diagnosis present

## 2019-12-29 LAB — CBC WITH DIFFERENTIAL/PLATELET
Abs Immature Granulocytes: 0.01 10*3/uL (ref 0.00–0.07)
Basophils Absolute: 0 10*3/uL (ref 0.0–0.1)
Basophils Relative: 0 %
Eosinophils Absolute: 0 10*3/uL (ref 0.0–0.5)
Eosinophils Relative: 0 %
HCT: 43.4 % (ref 36.0–46.0)
Hemoglobin: 14.1 g/dL (ref 12.0–15.0)
Immature Granulocytes: 0 %
Lymphocytes Relative: 25 %
Lymphs Abs: 1.2 10*3/uL (ref 0.7–4.0)
MCH: 25.4 pg — ABNORMAL LOW (ref 26.0–34.0)
MCHC: 32.5 g/dL (ref 30.0–36.0)
MCV: 78.1 fL — ABNORMAL LOW (ref 80.0–100.0)
Monocytes Absolute: 0.3 10*3/uL (ref 0.1–1.0)
Monocytes Relative: 6 %
Neutro Abs: 3.5 10*3/uL (ref 1.7–7.7)
Neutrophils Relative %: 69 %
Platelets: 314 10*3/uL (ref 150–400)
RBC: 5.56 MIL/uL — ABNORMAL HIGH (ref 3.87–5.11)
RDW: 13.8 % (ref 11.5–15.5)
WBC: 5 10*3/uL (ref 4.0–10.5)
nRBC: 0 % (ref 0.0–0.2)

## 2019-12-29 LAB — COMPREHENSIVE METABOLIC PANEL
ALT: 50 U/L — ABNORMAL HIGH (ref 0–44)
AST: 54 U/L — ABNORMAL HIGH (ref 15–41)
Albumin: 3.9 g/dL (ref 3.5–5.0)
Alkaline Phosphatase: 67 U/L (ref 38–126)
Anion gap: 14 (ref 5–15)
BUN: <5 mg/dL — ABNORMAL LOW (ref 6–20)
CO2: 21 mmol/L — ABNORMAL LOW (ref 22–32)
Calcium: 9.2 mg/dL (ref 8.9–10.3)
Chloride: 103 mmol/L (ref 98–111)
Creatinine, Ser: 0.72 mg/dL (ref 0.44–1.00)
GFR calc Af Amer: >60 mL/min (ref >60)
GFR calc non Af Amer: >60 mL/min (ref >60)
Glucose, Bld: 111 mg/dL — ABNORMAL HIGH (ref 70–99)
Potassium: 3.2 mmol/L — ABNORMAL LOW (ref 3.5–5.1)
Sodium: 138 mmol/L (ref 135–145)
Total Bilirubin: 0.4 mg/dL (ref 0.3–1.2)
Total Protein: 8.6 g/dL — ABNORMAL HIGH (ref 6.5–8.1)

## 2019-12-29 LAB — I-STAT BETA HCG BLOOD, ED (MC, WL, AP ONLY): I-stat hCG, quantitative: <5 m[IU]/mL (ref <5)

## 2019-12-29 MED ORDER — HYDROCOD POLST-CPM POLST ER 10-8 MG/5ML PO SUER: 5.0000 mL | Freq: Two times a day (BID) | ORAL | 0 refills | Status: DC | PRN

## 2019-12-29 MED ORDER — ACETAMINOPHEN 500 MG PO TABS
1000.0000 mg | ORAL_TABLET | Freq: Once | ORAL | Status: AC
  Administered 2019-12-29: 1000 mg via ORAL
  Filled 2019-12-29: qty 2

## 2019-12-29 NOTE — ED Triage Notes (Signed)
Patient reports being diagnosed with COVID at [facility] last week. Reports she does not feel any better. Cough, body aches, fever/chills. Febrile in triage. Reports she last took Tylenol this AM

## 2019-12-30 NOTE — ED Provider Notes (Signed)
[facility] EMERGENCY DEPARTMENT Provider Note   CSN: [id number] Arrival date & time: 12/29/19  2016     History Chief Complaint  Patient presents with  . COVID+    Madeline Garcia is a 24 y.o. female.  The history is provided by the patient.  Cough Cough characteristics:  Non-productive Severity:  Moderate Onset quality:  Gradual Duration:  10 days Timing:  Intermittent Progression:  Worsening Chronicity:  New Relieved by:  Nothing Worsened by:  Nothing Ineffective treatments:  Cough suppressants Associated symptoms: chills, fever and myalgias   Associated symptoms: no chest pain and no shortness of breath   Patient is an otherwise healthy 24 year old female who presents with COVID-19.  Patient was diagnosed with COVID-19 approximately 7 days ago.  However she reports that her cough started several days before that.  Over the past several days she is had increasing cough and abdominal soreness from coughing.  No shortness of breath or chest pain is reported.  She is un-vaccinated for COVID-19.     Past Medical History:  Diagnosis Date  . Medical history non-contributory     Patient Active Problem List   Diagnosis Date Noted  . Tinea versicolor 02/04/2019  . Birth control counseling 02/04/2019    Past Surgical History:  Procedure Laterality Date  . NO PAST SURGERIES       OB History    Gravida  1   Para  1   Term  1   Preterm      AB      Living  1     SAB      TAB      Ectopic      Multiple  0   Live Births  1           Family History  Problem Relation Age of Onset  . Cancer Mother        Breast    Social History   Tobacco Use  . Smoking status: Never Smoker  . Smokeless tobacco: Never Used  Vaping Use  . Vaping Use: Never used  Substance Use Topics  . Alcohol use: Never  . Drug use: Never    Home Medications Prior to Admission medications   Medication Sig Start Date End Date Taking? Authorizing  Provider  cetirizine (ZYRTEC ALLERGY) 10 MG tablet Take 1 tablet (10 mg total) by mouth daily. 12/22/19   Wallis Bamberg, PA-C  chlorpheniramine-HYDROcodone [facility] ER) 10-8 MG/5ML SUER Take 5 mLs by mouth 2 (two) times daily as needed for cough. 12/29/19   Zadie Rhine, MD  ondansetron (ZOFRAN-ODT) 8 MG disintegrating tablet Take 1 tablet (8 mg total) by mouth every 8 (eight) hours as needed for nausea or vomiting. 12/22/19   Wallis Bamberg, PA-C  pseudoephedrine (SUDAFED) 60 MG tablet Take 1 tablet (60 mg total) by mouth every 8 (eight) hours as needed for congestion. 12/22/19   Wallis Bamberg, PA-C    Allergies    Patient has no known allergies.  Review of Systems   Review of Systems  Constitutional: Positive for chills and fever.  HENT:       Anosmia  Respiratory: Positive for cough. Negative for shortness of breath.   Cardiovascular: Negative for chest pain.  Gastrointestinal: Positive for diarrhea.  Musculoskeletal: Positive for myalgias.  All other systems reviewed and are negative.   Physical Exam Updated Vital Signs BP 106/60   Pulse 99   Temp 98.7 F (37.1 C) (Oral)  Resp (!) 25   Ht 1.626 m (5\' 4" )   Wt 124.7 kg   SpO2 96%   BMI 47.19 kg/m   Physical Exam CONSTITUTIONAL: Well developed/well nourished HEAD: Normocephalic/atraumatic EYES: EOMI/PERRL ENMT: Mask in place NECK: supple no meningeal signs SPINE/BACK:entire spine nontender CV: S1/S2 noted, no murmurs/rubs/gallops noted LUNGS: Lungs are clear to auscultation bilaterally, no apparent distress ABDOMEN: soft, nontender, no rebound or guarding, bowel sounds noted throughout abdomen GU:no cva tenderness NEURO: Pt is awake/alert/appropriate, moves all extremitiesx4.  No facial droop.   EXTREMITIES: pulses normal/equal, full ROM SKIN: warm, color normal PSYCH: no abnormalities of mood noted, alert and oriented to situation  ED Results / Procedures / Treatments   Labs (all labs ordered are  listed, but only abnormal results are displayed) Labs Reviewed  COMPREHENSIVE METABOLIC PANEL - Abnormal; Notable for the following components:      Result Value   Potassium 3.2 (*)    CO2 21 (*)    Glucose, Bld 111 (*)    BUN <5 (*)    Total Protein 8.6 (*)    AST 54 (*)    ALT 50 (*)    All other components within normal limits  CBC WITH DIFFERENTIAL/PLATELET - Abnormal; Notable for the following components:   RBC 5.56 (*)    MCV 78.1 (*)    MCH 25.4 (*)    All other components within normal limits  I-STAT BETA HCG BLOOD, ED ([facility], [facility], [facility] ONLY)    EKG None  Radiology DG Chest 1 View  Result Date: 12/29/2019 CLINICAL DATA:  Cough and fever.  COVID positive last week. EXAM: CHEST  1 VIEW COMPARISON:  Radiograph 12/22/2019 FINDINGS: Mild hazy opacity at the right greater than left lung base, new from prior. Heart is normal in size with normal mediastinal contours. No pulmonary edema, pleural effusion, or pneumothorax. No acute osseous abnormalities are seen. IMPRESSION: Hazy opacity in the right greater than left lung base new from radiograph left week, likely due to COVID-19 pneumonia. Electronically Signed   By: 12/24/2019 M.D.   On: 12/29/2019 21:44    Procedures Procedures   Medications Ordered in ED Medications  acetaminophen (TYLENOL) tablet 1,000 mg (1,000 mg Oral Given 12/29/19 2112)    ED Course  I have reviewed the triage vital signs and the nursing notes.  Pertinent labs & imaging results that were available during my care of the patient were reviewed by me and considered in my medical decision making (see chart for details).    MDM Rules/Calculators/A&P                          Patient presents with COVID-19.  She reports being mostly concerned about her abdominal soreness from coughing.  She reports the medications from urgent care last week did not help her symptoms.  I will prescribe a new cough suppressant for her.  No hypoxia, no acute distress is  noted.  Patient does have some opacities on x-ray consistent with Covid pneumonia. Patient symptoms have been ongoing for up to 10 days. We discussed return precautions, patient is comfortable w/ discharge home   EVER HALBERG was evaluated in Emergency Department on 12/30/2019 for the symptoms described in the history of present illness. She was evaluated in the context of the global COVID-19 pandemic, which necessitated consideration that the patient might be at risk for infection with the SARS-CoV-2 virus that causes COVID-19. Institutional protocols and algorithms that  pertain to the evaluation of patients at risk for COVID-19 are in a state of rapid change based on information released by regulatory bodies including the CDC and federal and state organizations. These policies and algorithms were followed during the patient's care in the ED.  Final Clinical Impression(s) / ED Diagnoses Final diagnoses:  COVID-19    Rx / DC Orders ED Discharge Orders         Ordered    chlorpheniramine-HYDROcodone (TUSSIONEX PENNKINETIC ER) 10-8 MG/5ML SUER  2 times daily PRN        12/29/19 2356           Zadie Rhine, MD 12/30/19 [id number]

## 2019-12-30 NOTE — ED Notes (Signed)
Discharge instructions discussed with pt. Pt verbalized understanding. Pt stable and ambulatory. No signature pad available. 

## 2020-01-12 ENCOUNTER — Ambulatory Visit (INDEPENDENT_AMBULATORY_CARE_PROVIDER_SITE_OTHER): Payer: Medicaid Other | Admitting: *Deleted

## 2020-01-12 ENCOUNTER — Other Ambulatory Visit: Payer: Self-pay

## 2020-01-12 VITALS — BP 114/79 | HR 97 | Ht 65.0 in | Wt 261.1 lb

## 2020-01-12 DIAGNOSIS — Z3042 Encounter for surveillance of injectable contraceptive: Secondary | ICD-10-CM

## 2020-01-12 MED ORDER — MEDROXYPROGESTERONE ACETATE 150 MG/ML IM SUSP
150.0000 mg | Freq: Once | INTRAMUSCULAR | Status: AC
  Administered 2020-01-12: 150 mg via INTRAMUSCULAR

## 2020-01-12 NOTE — Progress Notes (Signed)
Here for depo-provera. Given without complaint. Explained she will need annual exam before next injection. She will make appointment for annual and for next depo-provera injection at check out.  Connor Meacham,RN

## 2020-01-15 ENCOUNTER — Ambulatory Visit (INDEPENDENT_AMBULATORY_CARE_PROVIDER_SITE_OTHER): Payer: Medicaid Other

## 2020-01-15 DIAGNOSIS — Z23 Encounter for immunization: Secondary | ICD-10-CM | POA: Diagnosis not present

## 2020-01-15 NOTE — Progress Notes (Signed)
° °  Covid-19 Vaccination Clinic  Name:  Madeline Garcia    MRN: [id number] DOB: [birth year]  01/15/2020  [name] was observed post Covid-19 immunization for 15 minutes without incident. She was provided with Vaccine Information Sheet and instruction to access the V-Safe system.   [name] was instructed to call 911 with any severe reactions post vaccine:  Difficulty breathing   Swelling of face and throat   A fast heartbeat   A bad rash all over body   Dizziness and weakness   Immunizations Administered    Name Date Dose VIS Date Route   Pfizer COVID-19 Vaccine 01/15/2020 12:11 PM 0.3 mL 07/01/2018 Intramuscular   Manufacturer: [employer], [employer]   Lot: [id number]   NDC: [address]-2

## 2020-02-06 ENCOUNTER — Other Ambulatory Visit: Payer: Self-pay

## 2020-02-06 ENCOUNTER — Ambulatory Visit (INDEPENDENT_AMBULATORY_CARE_PROVIDER_SITE_OTHER): Payer: Medicaid Other

## 2020-02-06 DIAGNOSIS — Z23 Encounter for immunization: Secondary | ICD-10-CM

## 2020-02-17 ENCOUNTER — Encounter (HOSPITAL_COMMUNITY): Payer: Self-pay | Admitting: Emergency Medicine

## 2020-02-17 ENCOUNTER — Ambulatory Visit (HOSPITAL_COMMUNITY)
Admission: EM | Admit: 2020-02-17 | Discharge: 2020-02-17 | Disposition: A | Discharge status: Home / Self Care | Payer: Medicaid Other | Attending: Family Medicine | Admitting: Family Medicine

## 2020-02-17 ENCOUNTER — Other Ambulatory Visit: Payer: Self-pay

## 2020-02-17 DIAGNOSIS — Z3202 Encounter for pregnancy test, result negative: Secondary | ICD-10-CM | POA: Diagnosis not present

## 2020-02-17 DIAGNOSIS — R350 Frequency of micturition: Secondary | ICD-10-CM

## 2020-02-17 DIAGNOSIS — N898 Other specified noninflammatory disorders of vagina: Secondary | ICD-10-CM | POA: Diagnosis present

## 2020-02-17 LAB — POCT URINALYSIS DIPSTICK, ED / UC
Bilirubin Urine: NEGATIVE
Glucose, UA: NEGATIVE mg/dL
Ketones, ur: NEGATIVE mg/dL
Nitrite: NEGATIVE
Protein, ur: NEGATIVE mg/dL
Specific Gravity, Urine: 1.025 (ref 1.005–1.030)
Urobilinogen, UA: 0.2 mg/dL (ref 0.0–1.0)
pH: 5.5 (ref 5.0–8.0)

## 2020-02-17 LAB — POC URINE PREG, ED: Preg Test, Ur: NEGATIVE

## 2020-02-17 MED ORDER — METRONIDAZOLE 500 MG PO TABS: 500.0000 mg | ORAL_TABLET | Freq: Two times a day (BID) | ORAL | 0 refills | Status: DC

## 2020-02-17 NOTE — Discharge Instructions (Signed)
Treating for BV based on symptoms Sending swab for testing and urine for culture  Follow up as needed for continued or worsening symptoms

## 2020-02-17 NOTE — ED Triage Notes (Signed)
Pt presents with urinary frequency, odor to urine, vaginal discharge, burning and irritation xs 3-4 weeks.

## 2020-02-18 ENCOUNTER — Telehealth (HOSPITAL_COMMUNITY): Payer: Self-pay | Admitting: Emergency Medicine

## 2020-02-18 LAB — CERVICOVAGINAL ANCILLARY ONLY
Bacterial Vaginitis (gardnerella): POSITIVE — AB
Candida Glabrata: NEGATIVE
Candida Vaginitis: POSITIVE — AB
Chlamydia: NEGATIVE
Comment: NEGATIVE
Comment: NEGATIVE
Comment: NEGATIVE
Comment: NEGATIVE
Comment: NEGATIVE
Comment: NORMAL
Neisseria Gonorrhea: NEGATIVE
Trichomonas: NEGATIVE

## 2020-02-18 MED ORDER — FLUCONAZOLE 150 MG PO TABS: 150.0000 mg | ORAL_TABLET | Freq: Once | ORAL | 0 refills | Status: AC

## 2020-02-18 NOTE — ED Provider Notes (Signed)
[facility]    CSN: [id number] Arrival date & time: 02/17/20  0831      History   Chief Complaint Chief Complaint  Patient presents with  . Urinary Frequency    HPI Madeline Garcia is a 24 y.o. female.   Pt is a 24 year old female that presents with urinary frequency, odor to urine, vaginal discharge and burning and irritation for 3 to 4 weeks. Symptoms have been constant.  Patient with history of bacterial vaginosis. Patient's last menstrual period was 01/27/2020.  Currently on Depo for birth control.  No abdominal pain, back pain, flank pain, nausea, vomiting or fevers.      Past Medical History:  Diagnosis Date  . Medical history non-contributory     Patient Active Problem List   Diagnosis Date Noted  . Tinea versicolor 02/04/2019  . Birth control counseling 02/04/2019    Past Surgical History:  Procedure Laterality Date  . NO PAST SURGERIES      OB History    Gravida  1   Para  1   Term  1   Preterm      AB      Living  1     SAB      TAB      Ectopic      Multiple  0   Live Births  1            Home Medications    Prior to Admission medications   Medication Sig Start Date End Date Taking? Authorizing Provider  cetirizine (ZYRTEC ALLERGY) 10 MG tablet Take 1 tablet (10 mg total) by mouth daily. 12/22/19   Wallis Bamberg, PA-C  chlorpheniramine-HYDROcodone [facility]) 10-8 MG/5ML SUER Take 5 mLs by mouth 2 (two) times daily as needed for cough. 12/29/19   Zadie Rhine, MD  metroNIDAZOLE (FLAGYL) 500 MG tablet Take 1 tablet (500 mg total) by mouth 2 (two) times daily. 02/17/20   Dahlia Byes A, NP  ondansetron (ZOFRAN-ODT) 8 MG disintegrating tablet Take 1 tablet (8 mg total) by mouth every 8 (eight) hours as needed for nausea or vomiting. 12/22/19   Wallis Bamberg, PA-C  pseudoephedrine (SUDAFED) 60 MG tablet Take 1 tablet (60 mg total) by mouth every 8 (eight) hours as needed for congestion. 12/22/19   Wallis Bamberg, PA-C    Family History Family History  Problem Relation Age of Onset  . Cancer Mother        Breast    Social History Social History   Tobacco Use  . Smoking status: Never Smoker  . Smokeless tobacco: Never Used  Vaping Use  . Vaping Use: Never used  Substance Use Topics  . Alcohol use: Never  . Drug use: Never     Allergies   Patient has no known allergies.   Review of Systems Review of Systems   Physical Exam Triage Vital Signs ED Triage Vitals  Enc Vitals Group     BP 02/17/20 0858 (!) 133/46     Pulse Rate 02/17/20 0858 98     Resp 02/17/20 0858 17     Temp 02/17/20 0858 99.8 F (37.7 C)     Temp Source 02/17/20 0858 Oral     SpO2 02/17/20 0858 100 %     Weight --      Height --      Head Circumference --      Peak Flow --      Pain Score 02/17/20 0857  0     Pain Loc --      Pain Edu? --      Excl. in [place]? --    No data found.  Updated Vital Signs BP (!) 133/46 (BP Location: Left Arm)   Pulse 98   Temp 99.8 F (37.7 C) (Oral)   Resp 17   LMP 01/27/2020   SpO2 100%   Visual Acuity Right Eye Distance:   Left Eye Distance:   Bilateral Distance:    Right Eye Near:   Left Eye Near:    Bilateral Near:     Physical Exam Vitals and nursing note reviewed.  Constitutional:      General: She is not in acute distress.    Appearance: Normal appearance. She is not ill-appearing, toxic-appearing or diaphoretic.  HENT:     Head: Normocephalic.     Nose: Nose normal.  Eyes:     Conjunctiva/sclera: Conjunctivae normal.  Pulmonary:     Effort: Pulmonary effort is normal.  Musculoskeletal:        General: Normal range of motion.     Cervical back: Normal range of motion.  Skin:    General: Skin is warm and dry.     Findings: No rash.  Neurological:     Mental Status: She is alert.  Psychiatric:        Mood and Affect: Mood normal.      UC Treatments / Results  Labs (all labs ordered are listed, but only abnormal results are  displayed) Labs Reviewed  URINE CULTURE - Abnormal; Notable for the following components:      Result Value   Culture   (*)    Value: >=100,000 COLONIES/mL ESCHERICHIA COLI SUSCEPTIBILITIES TO FOLLOW Performed at [facility], [address], [place], [place] [address]    All other components within normal limits  POCT URINALYSIS DIPSTICK, ED / UC - Abnormal; Notable for the following components:   Hgb urine dipstick TRACE (*)    Leukocytes,Ua TRACE (*)    All other components within normal limits  POC URINE PREG, ED  CERVICOVAGINAL ANCILLARY ONLY    EKG   Radiology No results found.  Procedures Procedures (including critical care time)  Medications Ordered in UC Medications - No data to display  Initial Impression / Assessment and Plan / UC Course  I have reviewed the triage vital signs and the nursing notes.  Pertinent labs & imaging results that were available during my care of the patient were reviewed by me and considered in my medical decision making (see chart for details).     Vaginal discharge Urine with trace leuks and trace hemoglobin.  Send for culture. Treating for BV based on symptoms and history. Follow up as needed for continued or worsening symptoms    final Clinical Impressions(s) / UC Diagnoses    Final diagnoses:  Vaginal discharge     Discharge Instructions     Treating for BV based on symptoms Sending swab for testing and urine for culture  Follow up as needed for continued or worsening symptoms     ED Prescriptions    Medication Sig Dispense Auth. Provider   metroNIDAZOLE (FLAGYL) 500 MG tablet Take 1 tablet (500 mg total) by mouth 2 (two) times daily. 14 tablet Flavia Bruss A, NP     PDMP not reviewed this encounter.   Janace Aris, NP 02/18/20 [phone]

## 2020-02-19 ENCOUNTER — Telehealth (HOSPITAL_COMMUNITY): Payer: Self-pay | Admitting: Emergency Medicine

## 2020-02-19 LAB — URINE CULTURE: Culture: >=100000 — AB

## 2020-02-19 MED ORDER — NITROFURANTOIN MONOHYD MACRO 100 MG PO CAPS: 100.0000 mg | ORAL_CAPSULE | Freq: Two times a day (BID) | ORAL | 0 refills | Status: DC

## 2020-03-04 ENCOUNTER — Ambulatory Visit (INDEPENDENT_AMBULATORY_CARE_PROVIDER_SITE_OTHER): Payer: Medicaid Other | Admitting: Medical

## 2020-03-04 ENCOUNTER — Encounter: Payer: Self-pay | Admitting: Medical

## 2020-03-04 ENCOUNTER — Other Ambulatory Visit: Payer: Self-pay

## 2020-03-04 VITALS — BP 116/61 | HR 86 | Ht 64.0 in | Wt 266.0 lb

## 2020-03-04 DIAGNOSIS — Z01419 Encounter for gynecological examination (general) (routine) without abnormal findings: Secondary | ICD-10-CM

## 2020-03-04 LAB — POCT PREGNANCY, URINE: Preg Test, Ur: NEGATIVE

## 2020-03-04 NOTE — Progress Notes (Signed)
  History:  [name] is a 24 y.o. G1P1001 who presents to clinic today for annual exam. The patient had normal pap smear 06/25/2018 and recent negative STD testing. She has been on Depo provera since the birth of her last child without any issues. She does endorse recent intermittent mild N/V and requests UPT. UPT is negative. Patient denies any other GYN concerns.    The following portions of the patient's history were reviewed and updated as appropriate: allergies, current medications, family history, past medical history, social history, past surgical history and problem list.  Review of Systems:  Review of Systems  Constitutional: Negative for fever.  Gastrointestinal: Positive for nausea. Negative for abdominal pain.  Genitourinary: Negative for dysuria, frequency and urgency.       Neg - vaginal bleeding, discharge      Objective:  Physical Exam BP 116/61   Pulse 86   Ht 5\' 4"  (1.626 m)   Wt 266 lb (120.7 kg)   Breastfeeding No   BMI 45.66 kg/m  Physical Exam Vitals and nursing note reviewed. Exam conducted with a chaperone present.  Constitutional:      General: She is not in acute distress.    Appearance: Normal appearance. She is well-developed. She is obese.  HENT:     Head: Normocephalic and atraumatic.  Cardiovascular:     Rate and Rhythm: Normal rate and regular rhythm.     Heart sounds: No murmur heard.   Pulmonary:     Effort: Pulmonary effort is normal. No respiratory distress.     Breath sounds: Normal breath sounds. No wheezing.  Abdominal:     General: Bowel sounds are normal. There is no distension.     Palpations: Abdomen is soft. There is no mass.     Tenderness: There is no abdominal tenderness. There is no guarding or rebound.  Genitourinary:    General: Normal vulva.     Cervix: No cervical motion tenderness.     Uterus: Not enlarged and not tender.      Adnexa:        Right: No mass or tenderness.         Left: No mass or  tenderness.    Skin:    General: Skin is warm and dry.     Findings: No erythema.  Neurological:     Mental Status: She is alert and oriented to person, place, and time.  Psychiatric:        Mood and Affect: Mood normal.        Behavior: Behavior normal.    Results for orders placed or performed in visit on 03/04/20 (from the past 24 hour(s))  Pregnancy, urine POC     Status: None   Collection Time: 03/04/20  9:17 AM  Result Value Ref Range   Preg Test, Ur NEGATIVE NEGATIVE     Assessment & Plan:  1. Women's annual routine gynecological examination - Continue Depo Provera as scheduled  - Next pap smear due 2023 - Return to [facility] otherwise  Approximately 15 minutes of total time was spent with this patient on history taking, physical exam and documentation.  2024, PA-C 03/04/2020 9:22 AM

## 2020-03-04 NOTE — Patient Instructions (Signed)
Medroxyprogesterone injection [Contraceptive] What is this medicine? MEDROXYPROGESTERONE (me DROX ee proe JES te rone) contraceptive injections prevent pregnancy. They provide effective birth control for 3 months. Depo-subQ Provera 104 is also used for treating pain related to endometriosis. This medicine may be used for other purposes; ask your health care provider or pharmacist if you have questions. COMMON BRAND NAME(S): Depo-Provera, Depo-subQ Provera 104 What should I tell my health care provider before I take this medicine? They need to know if you have any of these conditions:  frequently drink alcohol  asthma  blood vessel disease or a history of a blood clot in the lungs or legs  bone disease such as osteoporosis  breast cancer  diabetes  eating disorder (anorexia nervosa or bulimia)  high blood pressure  HIV infection or AIDS  kidney disease  liver disease  mental depression  migraine  seizures (convulsions)  stroke  tobacco smoker  vaginal bleeding  an unusual or allergic reaction to medroxyprogesterone, other hormones, medicines, foods, dyes, or preservatives  pregnant or trying to get pregnant  breast-feeding How should I use this medicine? Depo-Provera Contraceptive injection is given into a muscle. Depo-subQ Provera 104 injection is given under the skin. These injections are given by a health care professional. You must not be pregnant before getting an injection. The injection is usually given during the first 5 days after the start of a menstrual period or 6 weeks after delivery of a baby. Talk to your pediatrician regarding the use of this medicine in children. Special care may be needed. These injections have been used in female children who have started having menstrual periods. Overdosage: If you think you have taken too much of this medicine contact a poison control center or emergency room at once. NOTE: This medicine is only for you. Do not  share this medicine with others. What if I miss a dose? Try not to miss a dose. You must get an injection once every 3 months to maintain birth control. If you cannot keep an appointment, call and reschedule it. If you wait longer than 13 weeks between Depo-Provera contraceptive injections or longer than 14 weeks between Depo-subQ Provera 104 injections, you could get pregnant. Use another method for birth control if you miss your appointment. You may also need a pregnancy test before receiving another injection. What may interact with this medicine? Do not take this medicine with any of the following medications:  bosentan This medicine may also interact with the following medications:  aminoglutethimide  antibiotics or medicines for infections, especially rifampin, rifabutin, rifapentine, and griseofulvin  aprepitant  barbiturate medicines such as phenobarbital or primidone  bexarotene  carbamazepine  medicines for seizures like ethotoin, felbamate, oxcarbazepine, phenytoin, topiramate  modafinil  St. John's wort This list may not describe all possible interactions. Give your health care provider a list of all the medicines, herbs, non-prescription drugs, or dietary supplements you use. Also tell them if you smoke, drink alcohol, or use illegal drugs. Some items may interact with your medicine. What should I watch for while using this medicine? This drug does not protect you against HIV infection (AIDS) or other sexually transmitted diseases. Use of this product may cause you to lose calcium from your bones. Loss of calcium may cause weak bones (osteoporosis). Only use this product for more than 2 years if other forms of birth control are not right for you. The longer you use this product for birth control the more likely you will be at risk   for weak bones. Ask your health care professional how you can keep strong bones. You may have a change in bleeding pattern or irregular periods.  Many females stop having periods while taking this drug. If you have received your injections on time, your chance of being pregnant is very low. If you think you may be pregnant, see your health care professional as soon as possible. Tell your health care professional if you want to get pregnant within the next year. The effect of this medicine may last a long time after you get your last injection. What side effects may I notice from receiving this medicine? Side effects that you should report to your doctor or health care professional as soon as possible:  allergic reactions like skin rash, itching or hives, swelling of the face, lips, or tongue  breast tenderness or discharge  breathing problems  changes in vision  depression  feeling faint or lightheaded, falls  fever  pain in the abdomen, chest, groin, or leg  problems with balance, talking, walking  unusually weak or tired  yellowing of the eyes or skin Side effects that usually do not require medical attention (report to your doctor or health care professional if they continue or are bothersome):  acne  fluid retention and swelling  headache  irregular periods, spotting, or absent periods  temporary pain, itching, or skin reaction at site where injected  weight gain This list may not describe all possible side effects. Call your doctor for medical advice about side effects. You may report side effects to FDA at 1-800-FDA-1088. Where should I keep my medicine? This does not apply. The injection will be given to you by a health care professional. NOTE: This sheet is a summary. It may not cover all possible information. If you have questions about this medicine, talk to your doctor, pharmacist, or health care provider.  2020 Elsevier/Gold Standard (2008-05-14 18:37:56)  

## 2020-03-29 ENCOUNTER — Ambulatory Visit (INDEPENDENT_AMBULATORY_CARE_PROVIDER_SITE_OTHER): Payer: Medicaid Other | Admitting: *Deleted

## 2020-03-29 ENCOUNTER — Other Ambulatory Visit: Payer: Self-pay

## 2020-03-29 ENCOUNTER — Encounter: Payer: Self-pay | Admitting: *Deleted

## 2020-03-29 VITALS — BP 119/76 | HR 85 | Ht 63.0 in | Wt 264.2 lb

## 2020-03-29 DIAGNOSIS — Z3042 Encounter for surveillance of injectable contraceptive: Secondary | ICD-10-CM | POA: Diagnosis not present

## 2020-03-29 MED ORDER — MEDROXYPROGESTERONE ACETATE 150 MG/ML IM SUSP
150.0000 mg | Freq: Once | INTRAMUSCULAR | Status: AC
  Administered 2020-03-29: 150 mg via INTRAMUSCULAR

## 2020-03-29 NOTE — Progress Notes (Signed)
Depo Provera 150 mg IM administered as scheduled. Pt tolerated well. Next injection due 2/8-2/22/22. Last Pap 06/25/18 - negative. Recent Annual Gyn exam completed on 03/04/20.

## 2020-03-29 NOTE — Progress Notes (Signed)
Patient was assessed and managed by nursing staff during this encounter. I have reviewed the chart and agree with the documentation and plan. I have also made any necessary editorial changes.  Thressa Sheller DNP, CNM  03/29/20  10:44 AM

## 2020-06-14 ENCOUNTER — Other Ambulatory Visit: Payer: Self-pay

## 2020-06-14 ENCOUNTER — Ambulatory Visit (INDEPENDENT_AMBULATORY_CARE_PROVIDER_SITE_OTHER): Payer: Medicaid Other | Admitting: General Practice

## 2020-06-14 VITALS — BP 118/93 | HR 98 | Ht 63.0 in | Wt 249.0 lb

## 2020-06-14 DIAGNOSIS — Z3042 Encounter for surveillance of injectable contraceptive: Secondary | ICD-10-CM

## 2020-06-14 MED ORDER — MEDROXYPROGESTERONE ACETATE 150 MG/ML IM SUSP
150.0000 mg | Freq: Once | INTRAMUSCULAR | Status: AC
  Administered 2020-06-14: 150 mg via INTRAMUSCULAR

## 2020-06-14 NOTE — Progress Notes (Signed)
Madeline Garcia here for Depo-Provera Injection. Injection administered without complication. Patient will return in 3 months for next injection between Apr 26 and May 10. Next annual visit due October 2022.   Marylynn Pearson, RN 06/14/2020  9:43 AM

## 2020-06-14 NOTE — Progress Notes (Signed)
Chart reviewed for nurse visit. Agree with plan of care.   Emanii Bugbee M, MD 06/14/20 1:03 PM 

## 2020-08-30 ENCOUNTER — Ambulatory Visit: Payer: Medicaid Other

## 2020-09-09 ENCOUNTER — Ambulatory Visit (INDEPENDENT_AMBULATORY_CARE_PROVIDER_SITE_OTHER): Payer: Medicaid Other | Admitting: *Deleted

## 2020-09-09 ENCOUNTER — Other Ambulatory Visit: Payer: Self-pay

## 2020-09-09 ENCOUNTER — Encounter: Payer: Self-pay | Admitting: *Deleted

## 2020-09-09 VITALS — BP 120/75 | HR 79 | Ht 64.0 in | Wt 247.3 lb

## 2020-09-09 DIAGNOSIS — Z3042 Encounter for surveillance of injectable contraceptive: Secondary | ICD-10-CM

## 2020-09-09 MED ORDER — MEDROXYPROGESTERONE ACETATE 150 MG/ML IM SUSP
150.0000 mg | Freq: Once | INTRAMUSCULAR | Status: AC
  Administered 2020-09-09: 150 mg via INTRAMUSCULAR

## 2020-09-09 NOTE — Progress Notes (Signed)
Depo Provera 150 mg IM administered as scheduled. Pt tolerated well. Next dose due 7/22-8/5. Last Pap done 06/25/18 - normal. Next Annual Gyn exam due after 03/04/21.  Pt voiced understanding of all information given.

## 2020-09-09 NOTE — Progress Notes (Signed)
Chart reviewed for nurse visit. Agree with plan of care.   Marny Lowenstein, PA-C 09/09/2020 9:10 AM

## 2020-12-02 ENCOUNTER — Ambulatory Visit: Payer: Medicaid Other

## 2021-01-12 IMAGING — US US OB COMP LESS 14 WK
1 series · 15 of 24 positions shown · non-contrast
Comparison: None.

CLINICAL DATA: Lower abdominal pain for 2 weeks. Gestational age by
last menstrual period 13 weeks. Positive pregnancy test.

EXAM:
OBSTETRIC <14 WK ULTRASOUND
TECHNIQUE: Transabdominal ultrasound was performed for evaluation of the
gestation as well as the maternal uterus and adnexal regions.

[Series 1: us ob comp less 14 wk · 15 of 24 slices shown]
[im 1/24]
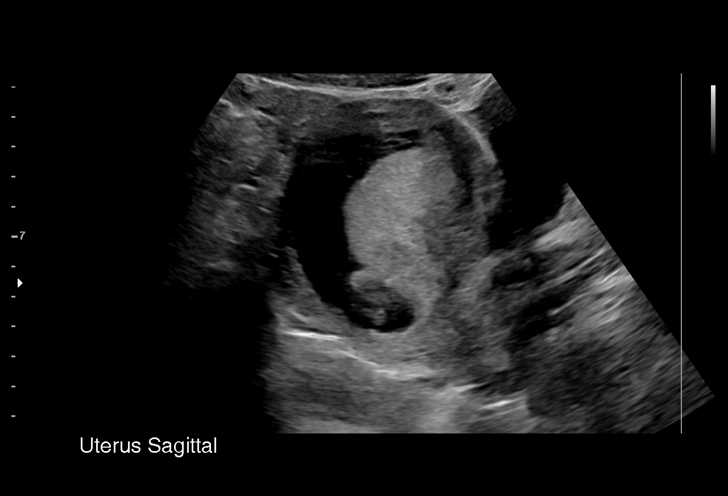
[im 3/24]
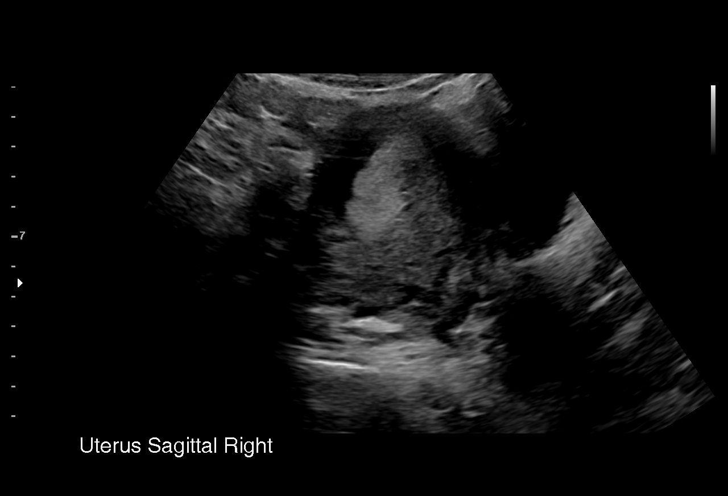
[im 5/24]
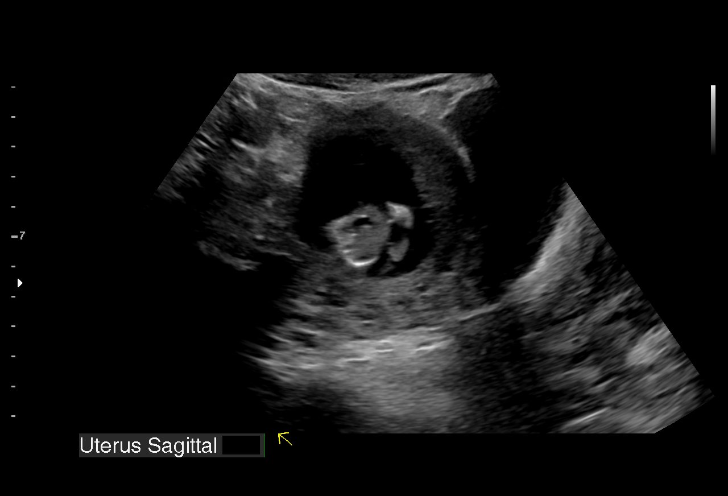
[im 6/24]
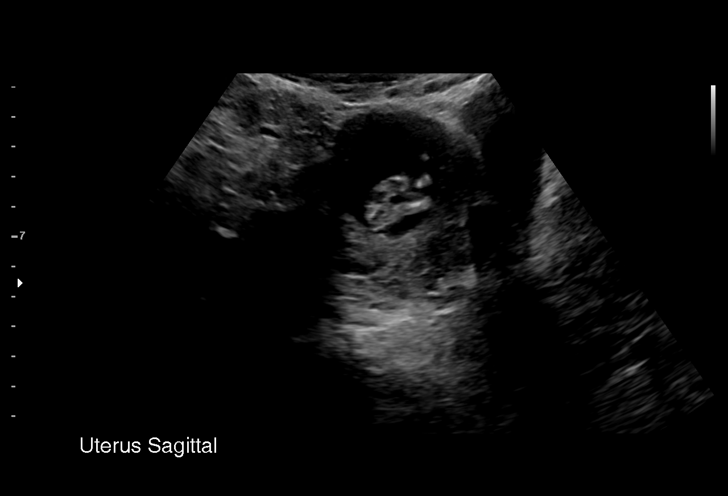
[im 8/24]
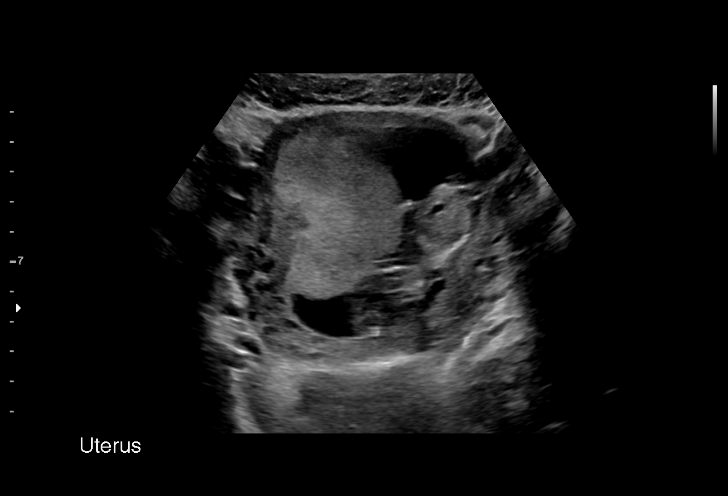
[im 9/24]
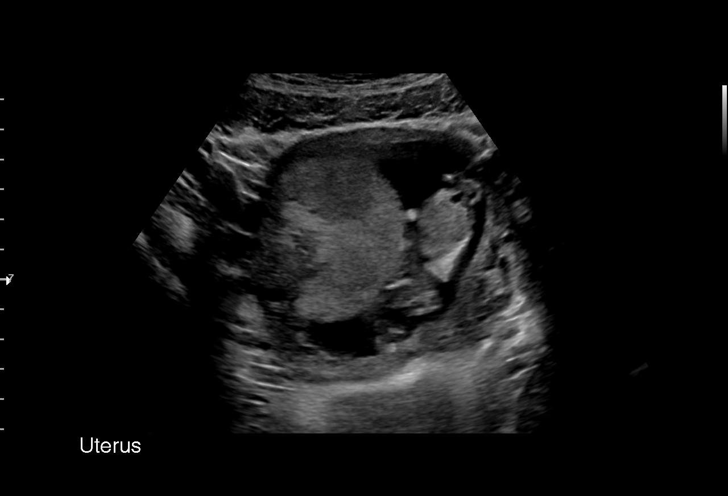
[im 11/24]
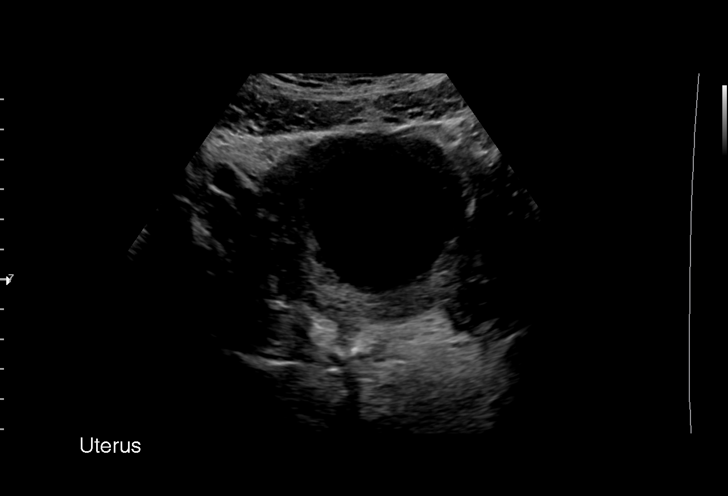
[im 13/24]
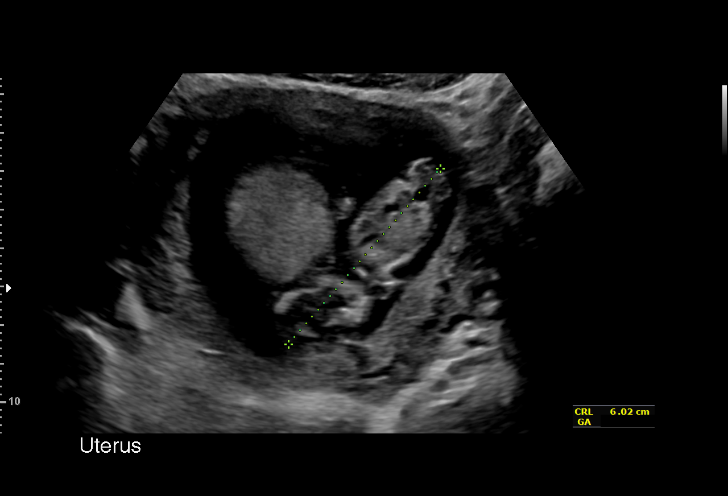
[im 14/24]
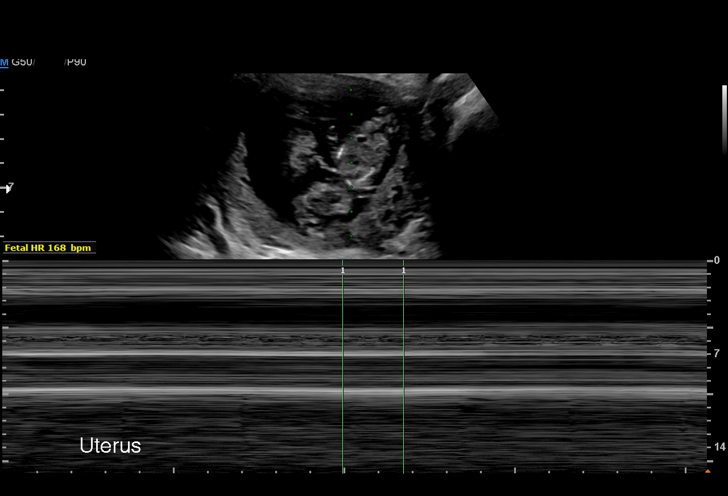
[im 16/24]
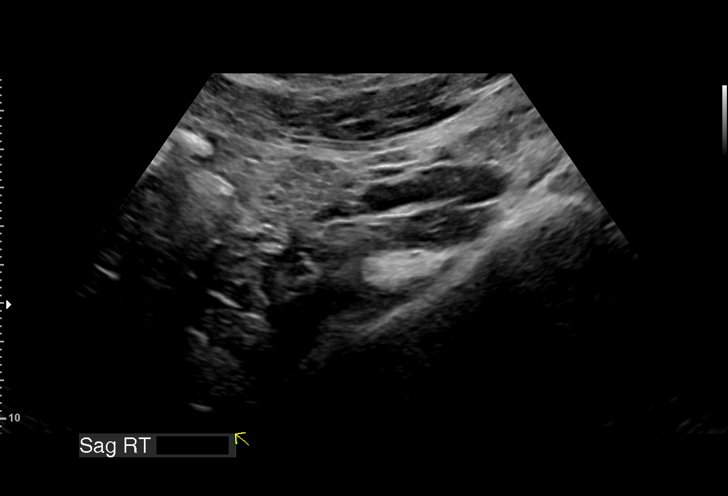
[im 17/24]
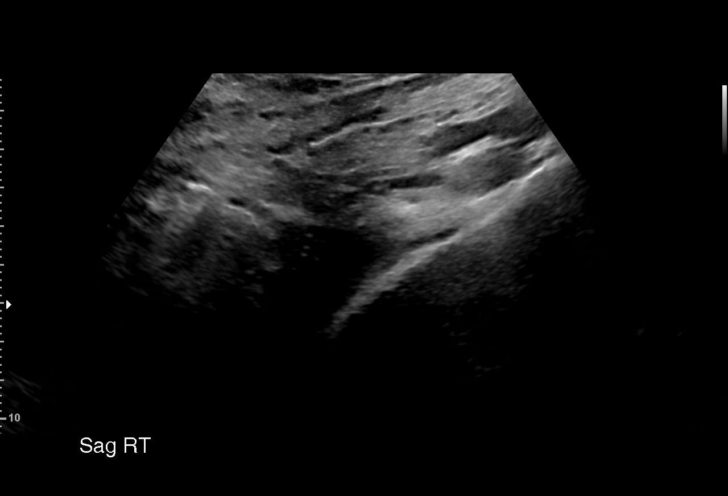
[im 19/24]
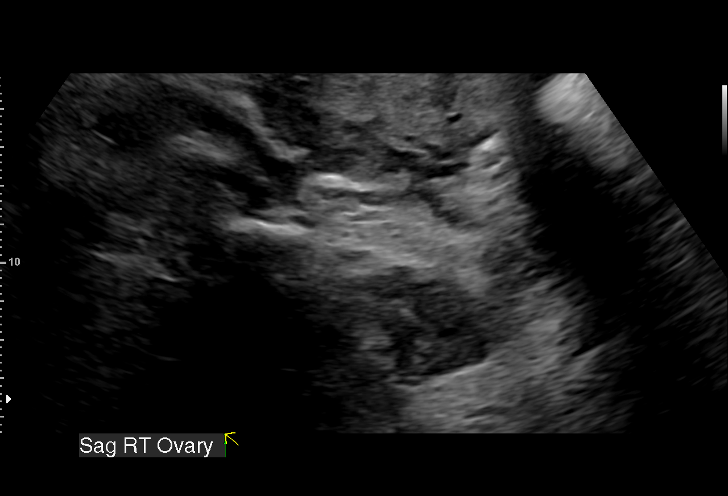
[im 21/24]
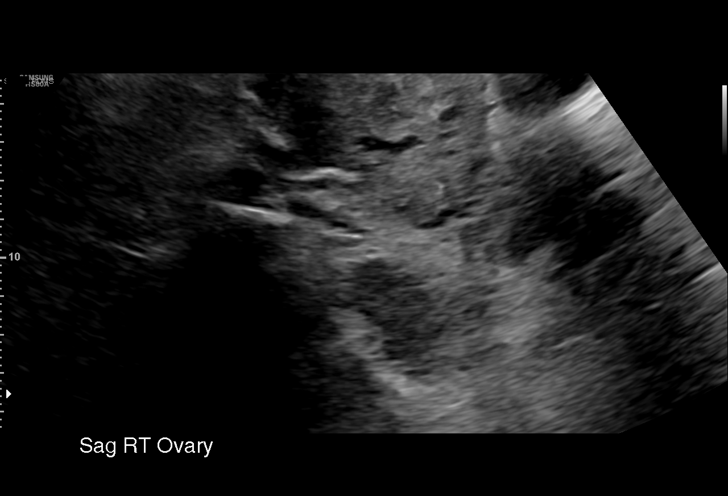
[im 22/24]
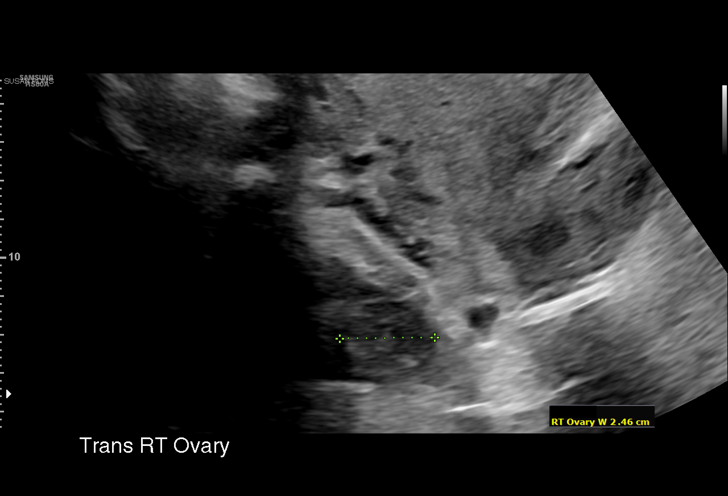
[im 24/24]
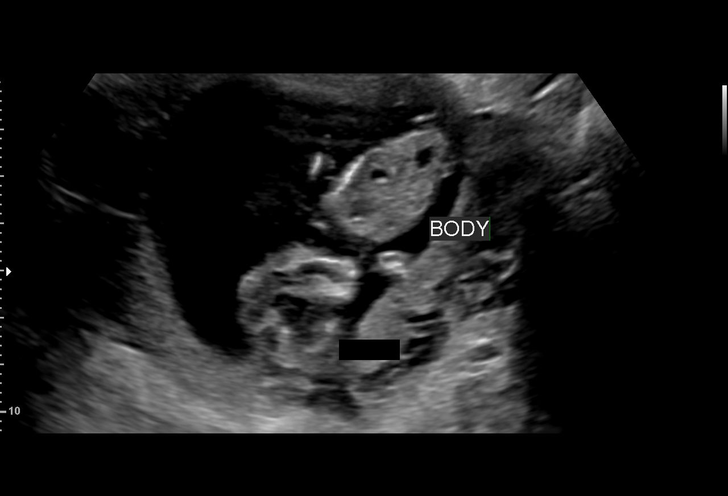

[15 of 24 positions shown; findings below may reference images not displayed]

FINDINGS: Intrauterine gestational sac: Present.

Yolk sac:  Not present.

Embryo:  Present.

Cardiac Activity: Present.

Heart Rate: 168 bpm

CRL: 61 mm   12 w 4 d                  US EDC: December 15, 2018

Subchorionic hemorrhage:  None visualized.

Maternal uterus/adnexae: Placental forming anteriorly. No free
fluid. Normal RIGHT ovary. LEFT ovary not sonographically
identified.
IMPRESSION: 1. Single live intrauterine pregnancy, gestational age by ultrasound
12 weeks and 4 days. No immediate complication.

## 2021-03-06 ENCOUNTER — Other Ambulatory Visit: Payer: Self-pay

## 2021-03-06 ENCOUNTER — Ambulatory Visit (HOSPITAL_COMMUNITY)
Admission: EM | Admit: 2021-03-06 | Discharge: 2021-03-06 | Disposition: A | Discharge status: Home / Self Care | Payer: Medicaid Other | Attending: Emergency Medicine | Admitting: Emergency Medicine

## 2021-03-06 ENCOUNTER — Encounter (HOSPITAL_COMMUNITY): Payer: Self-pay

## 2021-03-06 DIAGNOSIS — N3000 Acute cystitis without hematuria: Secondary | ICD-10-CM | POA: Diagnosis not present

## 2021-03-06 LAB — POCT URINALYSIS DIPSTICK, ED / UC
Bilirubin Urine: NEGATIVE
Glucose, UA: NEGATIVE mg/dL
Ketones, ur: NEGATIVE mg/dL
Nitrite: POSITIVE — AB
Protein, ur: NEGATIVE mg/dL
Specific Gravity, Urine: 1.02 (ref 1.005–1.030)
Urobilinogen, UA: 0.2 mg/dL (ref 0.0–1.0)
pH: 7.5 (ref 5.0–8.0)

## 2021-03-06 LAB — POC URINE PREG, ED: Preg Test, Ur: NEGATIVE

## 2021-03-06 MED ORDER — IBUPROFEN 800 MG PO TABS: 800.0000 mg | ORAL_TABLET | Freq: Three times a day (TID) | ORAL | 0 refills | Status: DC

## 2021-03-06 MED ORDER — CYCLOBENZAPRINE HCL 10 MG PO TABS: 10.0000 mg | ORAL_TABLET | Freq: Two times a day (BID) | ORAL | 0 refills | Status: DC | PRN

## 2021-03-06 MED ORDER — NITROFURANTOIN MONOHYD MACRO 100 MG PO CAPS: 100.0000 mg | ORAL_CAPSULE | Freq: Two times a day (BID) | ORAL | 0 refills | Status: DC

## 2021-03-06 MED ORDER — ALUMINUM-MAGNESIUM-SIMETHICONE 200-200-20 MG/5ML PO SUSP: 30.0000 mL | Freq: Three times a day (TID) | ORAL | 0 refills | Status: DC

## 2021-03-06 NOTE — ED Triage Notes (Signed)
Pt reports lower abdominal pain since this morning.

## 2021-03-06 NOTE — Discharge Instructions (Addendum)
Your urinalysis was positive for infection, it has been sent to the lab to determine exactly which bacteria is growing  Start taking Macrobid twice a day for the next 5 days, if your antibiotic needs to be changed you will be notified  You do not need to pick up any of the other medicines sent to the pharmacy and may use over-the-counter ibuprofen or Tylenol for the pain this should start to improve as you take the antibiotics  Increase water intake to help flush the kidneys, and practice good hygiene, wiping front to back and urinating after sexual encounters  May follow-up with urgent care as needed for persistent or reoccurring symptoms

## 2021-03-06 NOTE — ED Provider Notes (Signed)
[facility]    CSN: [id number] Arrival date & time: 03/06/21  1044      History   Chief Complaint Chief Complaint  Patient presents with   Abdominal Pain    HPI Madeline Garcia is a 25 y.o. female.   Patient presents with lower abdominal pain beginning this morning described as sharp.  Denies nausea, vomiting, diarrhea, constipation, fever, chills, any urinary changes, vaginal discharge, hematuria, odor, new rash or lesions.  Sexually active, sometimes condom use, possibility of pregnancy.  Menstrual periods to begin sometime this week.   Past Medical History:  Diagnosis Date   Medical history non-contributory     Patient Active Problem List   Diagnosis Date Noted   Tinea versicolor 02/04/2019   Birth control counseling 02/04/2019    Past Surgical History:  Procedure Laterality Date   NO PAST SURGERIES      OB History     Gravida  1   Para  1   Term  1   Preterm      AB      Living  1      SAB      IAB      Ectopic      Multiple  0   Live Births  1            Home Medications    Prior to Admission medications   Medication Sig Start Date End Date Taking? Authorizing Provider  aluminum-magnesium hydroxide-simethicone (MAALOX) 200-200-20 MG/5ML SUSP Take 30 mLs by mouth 4 (four) times daily -  before meals and at bedtime. 03/06/21  Yes Yuktha Kerchner, Elita Boone, NP  cyclobenzaprine (FLEXERIL) 10 MG tablet Take 1 tablet (10 mg total) by mouth 2 (two) times daily as needed for muscle spasms. 03/06/21  Yes Karolyne Timmons R, NP  ibuprofen (ADVIL) 800 MG tablet Take 1 tablet (800 mg total) by mouth 3 (three) times daily. 03/06/21  Yes Ayona Yniguez R, NP  nitrofurantoin, macrocrystal-monohydrate, (MACROBID) 100 MG capsule Take 1 capsule (100 mg total) by mouth 2 (two) times daily. 03/06/21  Yes Matheson Vandehei, Elita Boone, NP    Family History Family History  Problem Relation Age of Onset   Cancer Mother        Breast    Social  History Social History   Tobacco Use   Smoking status: Never   Smokeless tobacco: Never  Vaping Use   Vaping Use: Never used  Substance Use Topics   Alcohol use: Never   Drug use: Never     Allergies   Patient has no known allergies.   Review of Systems Review of Systems  Constitutional: Negative.   HENT: Negative.    Respiratory: Negative.    Cardiovascular: Negative.   Gastrointestinal:  Positive for abdominal pain. Negative for abdominal distention, anal bleeding, blood in stool, constipation, diarrhea, nausea, rectal pain and vomiting.  Genitourinary: Negative.   Skin: Negative.   Neurological: Negative.     Physical Exam Triage Vital Signs ED Triage Vitals  Enc Vitals Group     BP 03/06/21 1306 121/78     Pulse Rate 03/06/21 1306 85     Resp 03/06/21 1306 18     Temp 03/06/21 1306 99 F (37.2 C)     Temp Source 03/06/21 1306 Oral     SpO2 03/06/21 1306 97 %     Weight --      Height --      Head Circumference --  Peak Flow --      Pain Score 03/06/21 1305 5     Pain Loc --      Pain Edu? --      Excl. in [place]? --    No data found.  Updated Vital Signs BP 121/78 (BP Location: Right Arm)   Pulse 85   Temp 99 F (37.2 C) (Oral)   Resp 18   LMP  (Within Weeks) Comment: 2-3 weeks  SpO2 97%   Visual Acuity Right Eye Distance:   Left Eye Distance:   Bilateral Distance:    Right Eye Near:   Left Eye Near:    Bilateral Near:     Physical Exam Constitutional:      Appearance: She is well-developed and normal weight.  HENT:     Head: Normocephalic.  Eyes:     Extraocular Movements: Extraocular movements intact.  Pulmonary:     Effort: Pulmonary effort is normal.  Abdominal:     General: Abdomen is flat. Bowel sounds are normal.     Palpations: Abdomen is soft.     Tenderness: There is abdominal tenderness in the suprapubic area. There is no right CVA tenderness or left CVA tenderness.  Skin:    General: Skin is warm and dry.   Neurological:     General: No focal deficit present.     Mental Status: She is oriented to person, place, and time.  Psychiatric:        Mood and Affect: Mood normal.        Behavior: Behavior normal.     UC Treatments / Results  Labs (all labs ordered are listed, but only abnormal results are displayed) Labs Reviewed  POCT URINALYSIS DIPSTICK, ED / UC - Abnormal; Notable for the following components:      Result Value   Hgb urine dipstick TRACE (*)    Nitrite POSITIVE (*)    Leukocytes,Ua SMALL (*)    All other components within normal limits  URINE CULTURE  POC URINE PREG, ED    EKG   Radiology No results found.  Procedures Procedures (including critical care time)  Medications Ordered in UC Medications - No data to display  Initial Impression / Assessment and Plan / UC Course  I have reviewed the triage vital signs and the nursing notes.  Pertinent labs & imaging results that were available during my care of the patient were reviewed by me and considered in my medical decision making (see chart for details).  Acute cystitis without hematuria  1.  Urinalysis showing nitrates and leukocytes, sent for culture 2.  Macrobid 100 mg twice daily for 5 days 3.  Urine pregnancy negative 4.  Declined STI screening 5.  Over-the-counter medications for symptom management 6.  Follow-up with urgent care as needed Final Clinical Impressions(s) / UC Diagnoses   Final diagnoses:  Acute cystitis without hematuria     Discharge Instructions      Your urinalysis was positive for infection, it has been sent to the lab to determine exactly which bacteria is growing  Start taking Macrobid twice a day for the next 5 days, if your antibiotic needs to be changed you will be notified  You do not need to pick up any of the other medicines sent to the pharmacy and may use over-the-counter ibuprofen or Tylenol for the pain this should start to improve as you take the  antibiotics  Increase water intake to help flush the kidneys, and practice good hygiene, wiping  front to back and urinating after sexual encounters  May follow-up with urgent care as needed for persistent or reoccurring symptoms   ED Prescriptions     Medication Sig Dispense Auth. Provider   ibuprofen (ADVIL) 800 MG tablet Take 1 tablet (800 mg total) by mouth 3 (three) times daily. 21 tablet Suzanne Garbers R, NP   cyclobenzaprine (FLEXERIL) 10 MG tablet Take 1 tablet (10 mg total) by mouth 2 (two) times daily as needed for muscle spasms. 20 tablet Lowella Petties R, NP   aluminum-magnesium hydroxide-simethicone (MAALOX) [id number] MG/5ML SUSP Take 30 mLs by mouth 4 (four) times daily -  before meals and at bedtime. 1,680 mL Darik Massing R, NP   nitrofurantoin, macrocrystal-monohydrate, (MACROBID) 100 MG capsule Take 1 capsule (100 mg total) by mouth 2 (two) times daily. 10 capsule Hans Eden, NP      PDMP not reviewed this encounter.   Hans Eden, NP 03/06/21 [phone]

## 2021-03-08 LAB — URINE CULTURE: Culture: >=100000 — AB

## 2021-05-01 IMAGING — US US MFM OB FOLLOW UP
1 series · 14 of 28 positions shown · non-contrast
Comparison: none

[Series 1: us mfm ob follow up · 14 of 54 slices shown]
[im 2/54]
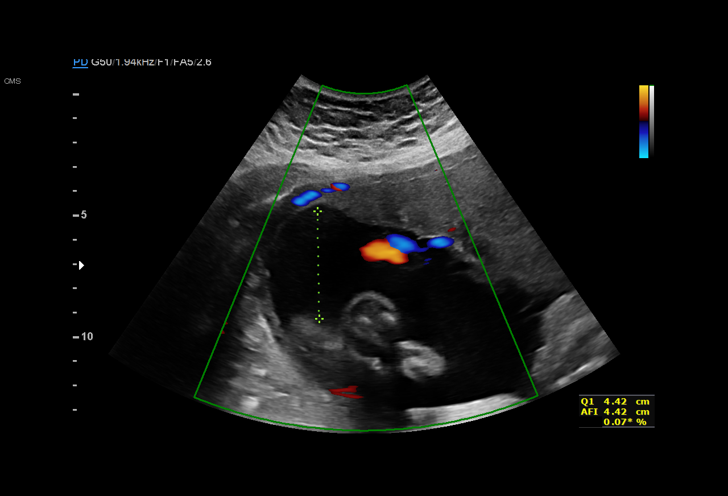
[im 6/54]
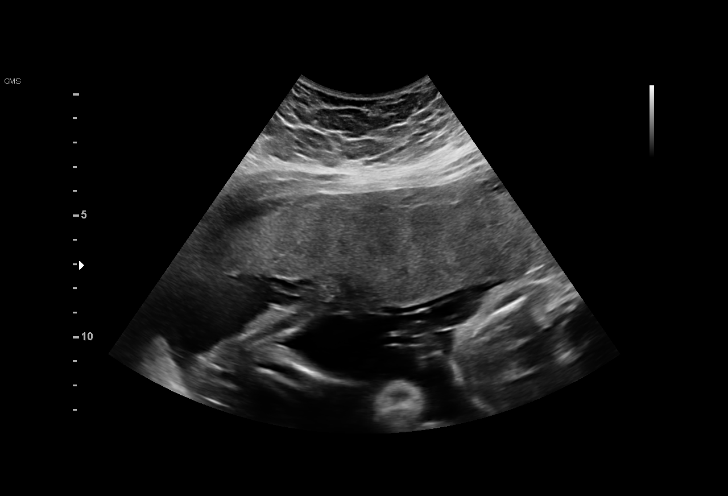
[im 10/54]
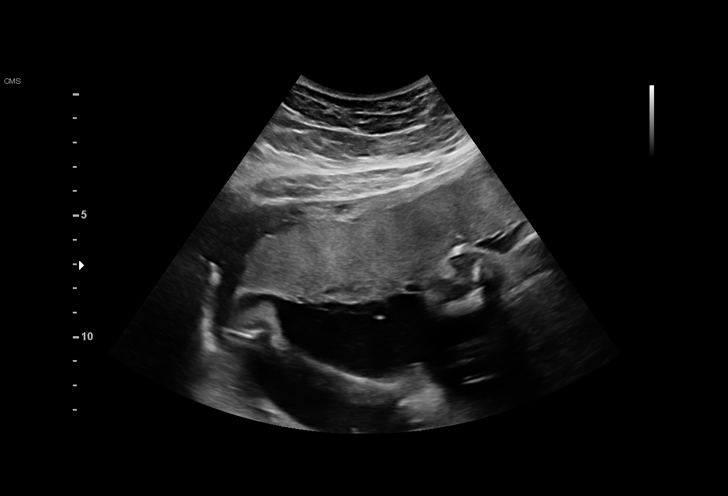
[im 14/54]
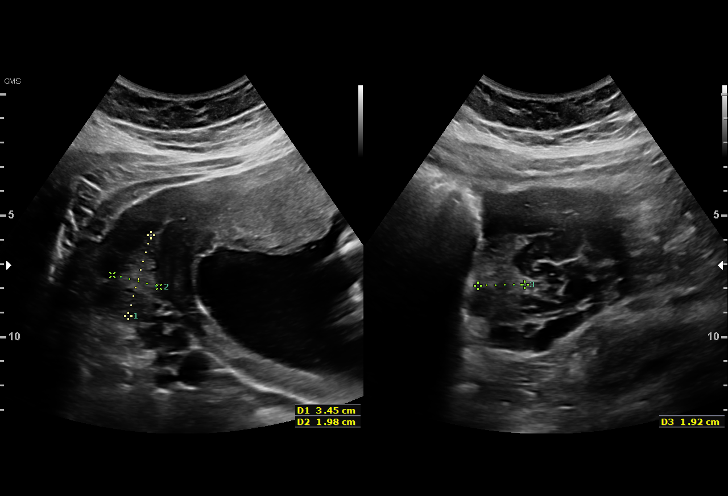
[im 18/54]
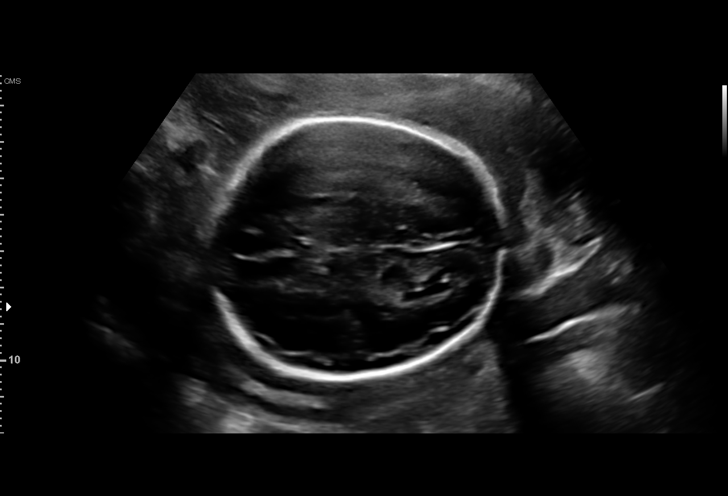
[im 22/54]
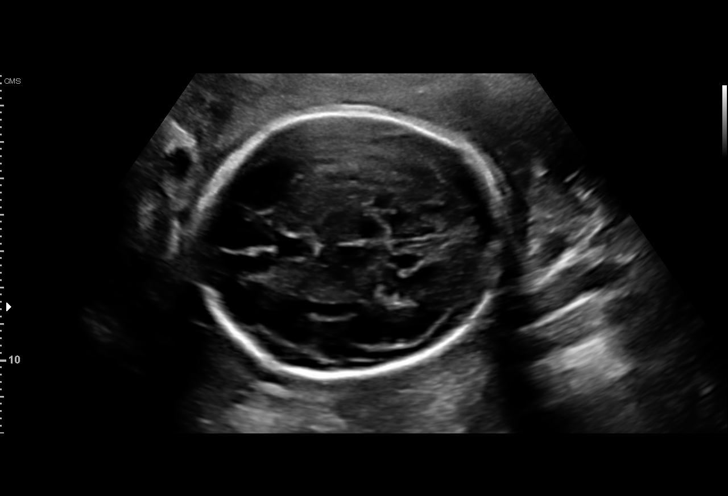
[im 26/54]
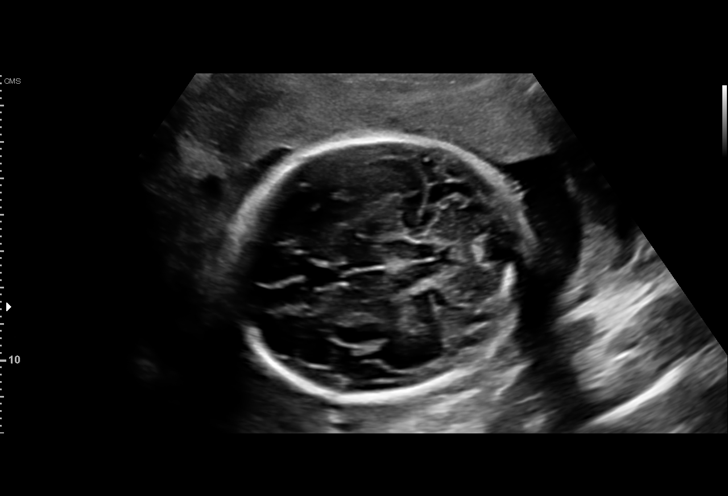
[im 30/54]
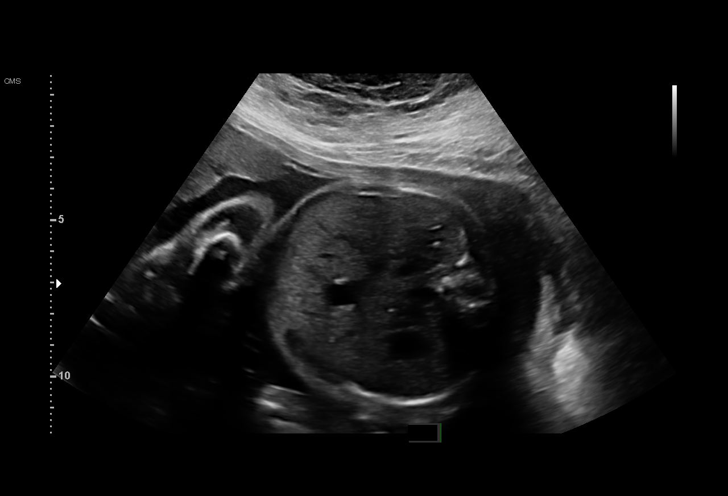
[im 34/54]
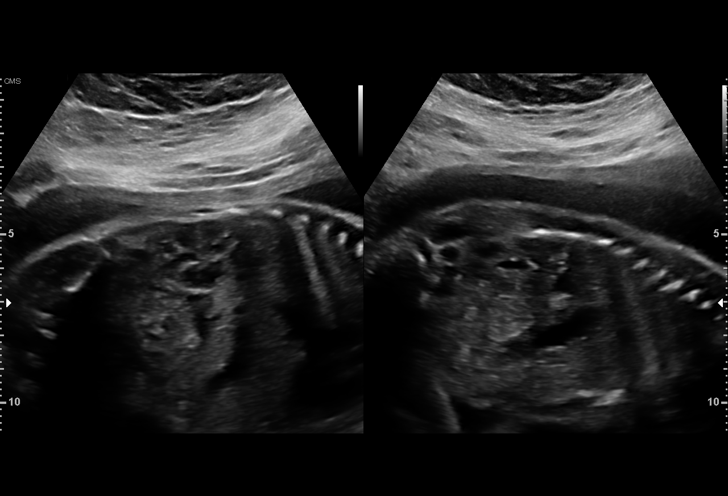
[im 38/54]
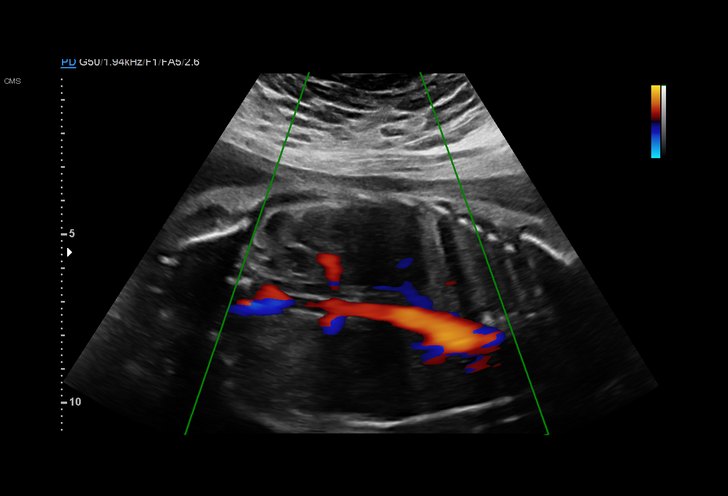
[im 42/54]
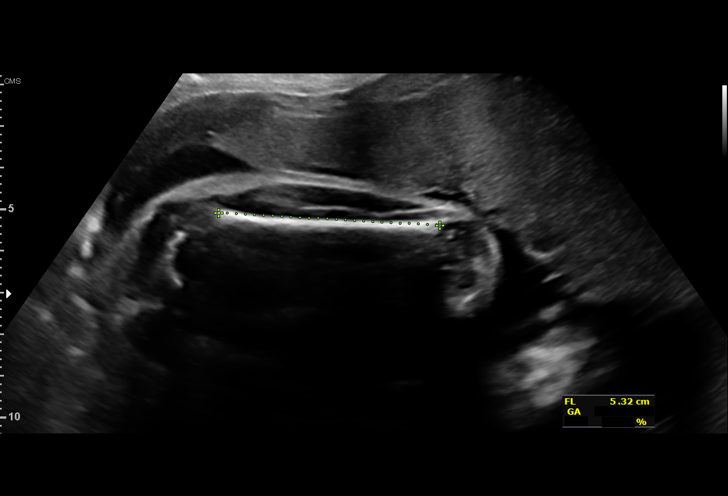
[im 46/54]
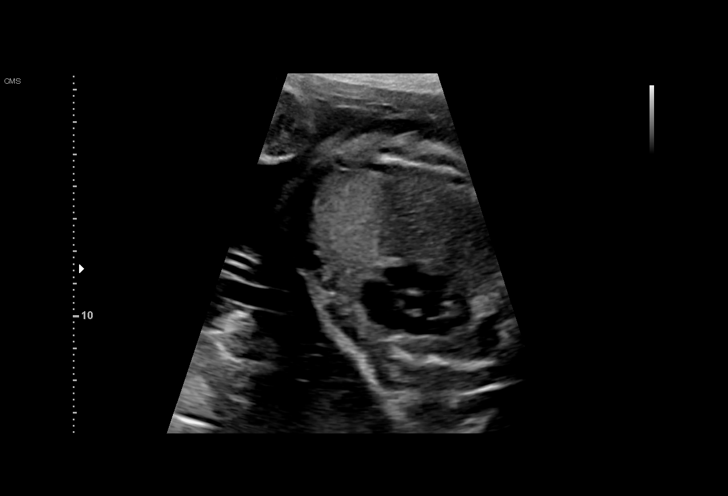
[im 50/54]
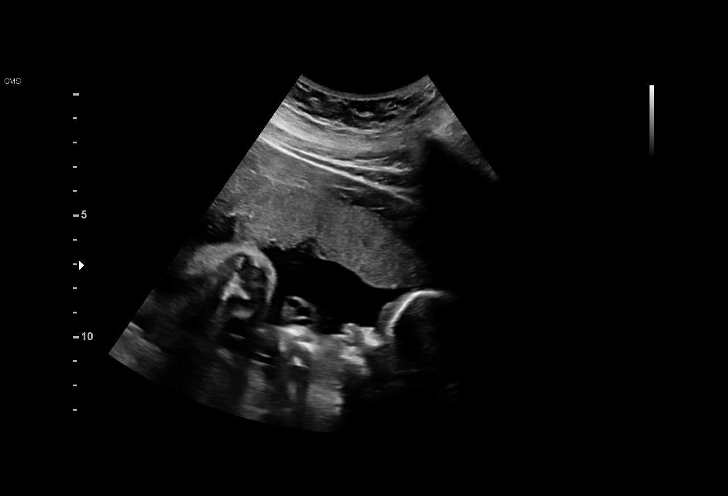
[im 54/54]
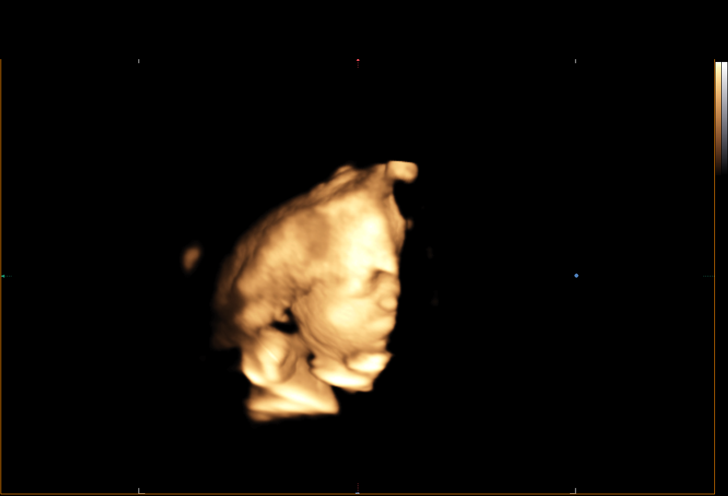

[14 of 28 positions shown; findings below may reference images not displayed]

RANKIN
 ----------------------------------------------------------------------

 ----------------------------------------------------------------------
Indications

  Encounter for other antenatal screening
  follow-up
  Obesity complicating pregnancy, third
  trimester (pregravid BMI 42) (low risk NIPS,
  neg AFP)
  28 weeks gestation of pregnancy
 ----------------------------------------------------------------------
Vital Signs

                                                Height:        5'4"
Fetal Evaluation

 Num Of Fetuses:         1
 Fetal Heart Rate(bpm):  150
 Cardiac Activity:       Observed
 Presentation:           Cephalic
 Placenta:               Anterior
 P. Cord Insertion:      Visualized

 Amniotic Fluid
 AFI FV:      Within normal limits

 AFI Sum(cm)     %Tile       Largest Pocket(cm)
 16.32           59

 RUQ(cm)       RLQ(cm)       LUQ(cm)        LLQ(cm)

Biometry
 BPD:      71.4  mm     G. Age:  28w 5d         55  %    CI:        76.36   %    70 - 86
                                                         FL/HC:      20.5   %    18.8 -
 HC:      258.9  mm     G. Age:  28w 1d         21  %    HC/AC:      1.04        1.05 -
 AC:      247.8  mm     G. Age:  29w 0d         69  %    FL/BPD:     74.5   %    71 - 87
 FL:       53.2  mm     G. Age:  28w 2d         38  %    FL/AC:      21.5   %    20 - 24
 HUM:      51.3  mm     G. Age:  30w 0d         84  %

 Est. FW:    6076  gm    2 lb 12 oz      61  %
OB History

 Gravidity:    1
Gestational Age

 LMP:           28w 1d        Date:  03/10/18                 EDD:   12/15/18
 U/S Today:     28w 4d                                        EDD:   12/12/18
 Best:          28w 1d     Det. By:  LMP  (03/10/18)          EDD:   12/15/18
Anatomy

 Cranium:               Appears normal         LVOT:                   Appears normal
 Cavum:                 Appears normal         Aortic Arch:            Appears normal
 Ventricles:            Appears normal         Ductal Arch:            Previously seen
 Choroid Plexus:        Appears normal         Diaphragm:              Previously seen
 Cerebellum:            Appears normal         Stomach:                Appears normal, left
                                                                       sided
 Posterior Fossa:       Previously seen        Abdomen:                Appears normal
 Nuchal Fold:           Previously seen        Abdominal Wall:         Previously seen
 Face:                  Profile nl; orbits     Cord Vessels:           Previously seen
                        prev visualized
 Lips:                  Appears normal         Kidneys:                Appear normal
 Palate:                Not well visualized    Bladder:                Appears normal
 Thoracic:              Appears normal         Spine:                  Previously seen
 Heart:                 Previously seen        Upper Extremities:      Previously seen
 RVOT:                  Appears normal         Lower Extremities:      Previously seen

 Other:  Heels previously visualized. Nasal bone visualized. Technically
         difficult due to maternal habitus and fetal position.
Cervix Uterus Adnexa

 Cervix
 Not visualized (advanced GA >68wks)

 Uterus
 No abnormality visualized.

 Left Ovary
 Not visualized.

 Right Ovary
 Within normal limits.

 Cul De Sac
 No free fluid seen.

 Adnexa
 No abnormality visualized.
Impression

 Normal interval growth.
 Anatomy now complete
Recommendations

 Follow up growth scheduled at 32 weeks given maternal BMI.

## 2021-05-07 NOTE — L&D Delivery Note (Signed)
   Delivery Note:   [name] at [redacted]w[redacted]d  Admitting diagnosis: GDM (gestational diabetes mellitus) [O24.419] Risks: GDM   CNM emergently called to bedside. Upon arrival to room CNM observed fetal head delivered . Patient in lithotomy position with  L&D staff support at bedside. , FOB present for birth and supportive.   Delivery of a Live born female  Birth Weight:   APGAR: ,   Newborn Delivery   Birth date/time: 12/15/2023 18:36:00 Delivery type: Vaginal, Spontaneous      Fetal head was in in cephalic presentation, in ROT position. With maternal pushing efforts remaining fetal body expelled and infant placed immediately skin to skin. At 1 minute of life infant crying vigorously.    Nuchal Cord: No    After 2 mins of life cord double clamped by CNM and cut by FOB.  Collection of cord blood for typing completed. Cord blood donation-None  Arterial cord blood sample-No    Third Stage:  With patient coughing efforts Placenta expelled-Spontaneous;Pathology  with 3 vessels . Uterine tone boggy with moderate bleeding. IV Pit bolus initiated. With fundal massage uterus firmed and bleeding slowed. During inspection for lacerations bleeding suddenly moderate and IV TXA ordered.   Uterotonics: IV Pitocin bolus, TXA ordered.  Placenta to Pathology.  None  laceration identified. Left labial abrasion; hemostatic.  Episiotomy:None  Local analgesia: n/a  Repair:N/a  Est. Blood Loss (mL):153.00   Complications: None   Mom to postpartum.  Baby girl "Kathryne Hitch" to Couplet care / Skin to Skin.  Delivery Report:   Review the Delivery Report for details.    Lamberto Dinapoli Danella Deis) Suzie Portela, MSN, CNM  [facility]  12/15/21 7:36 PM

## 2021-05-15 ENCOUNTER — Encounter (HOSPITAL_COMMUNITY): Payer: Self-pay | Admitting: Emergency Medicine

## 2021-05-15 ENCOUNTER — Other Ambulatory Visit: Payer: Self-pay

## 2021-05-15 ENCOUNTER — Ambulatory Visit (HOSPITAL_COMMUNITY)
Admission: EM | Admit: 2021-05-15 | Discharge: 2021-05-15 | Disposition: A | Discharge status: Home / Self Care | Payer: Medicaid Other | Attending: Emergency Medicine | Admitting: Emergency Medicine

## 2021-05-15 DIAGNOSIS — Z3201 Encounter for pregnancy test, result positive: Secondary | ICD-10-CM | POA: Diagnosis not present

## 2021-05-15 DIAGNOSIS — R109 Unspecified abdominal pain: Secondary | ICD-10-CM

## 2021-05-15 DIAGNOSIS — Z3490 Encounter for supervision of normal pregnancy, unspecified, unspecified trimester: Secondary | ICD-10-CM

## 2021-05-15 LAB — POC URINE PREG, ED: Preg Test, Ur: POSITIVE — AB

## 2021-05-15 LAB — POCT URINALYSIS DIPSTICK, ED / UC
Bilirubin Urine: NEGATIVE
Glucose, UA: NEGATIVE mg/dL
Ketones, ur: 40 mg/dL — AB
Nitrite: POSITIVE — AB
Protein, ur: 30 mg/dL — AB
Specific Gravity, Urine: 1.025 (ref 1.005–1.030)
Urobilinogen, UA: 1 mg/dL (ref 0.0–1.0)
pH: 5.5 (ref 5.0–8.0)

## 2021-05-15 MED ORDER — PRENATAL PLUS VITAMIN/MINERAL 27-1 MG PO TABS: 1.0000 | ORAL_TABLET | Freq: Every day | ORAL | 0 refills | Status: AC

## 2021-05-15 NOTE — ED Provider Notes (Signed)
[facility]    CSN: [id number] Arrival date & time: 05/15/21  1015    HISTORY   Chief Complaint  Patient presents with   Abdominal Pain   HPI Madeline Garcia is a 26 y.o. female. Patient reports feeling bad for 1-1/2 weeks.  Patient states she has been having lower abdominal pain and has had a single episode of vomiting.  Patient endorses a history of constipation.  Patient states she has taken Maalox in the past for this, states currently it is not helping..  The history is provided by the patient. No language interpreter was used.  Past Medical History:  Diagnosis Date   Medical history non-contributory    Patient Active Problem List   Diagnosis Date Noted   Tinea versicolor 02/04/2019   Birth control counseling 02/04/2019   Past Surgical History:  Procedure Laterality Date   NO PAST SURGERIES     OB History     Gravida  1   Para  1   Term  1   Preterm      AB      Living  1      SAB      IAB      Ectopic      Multiple  0   Live Births  1          Home Medications    Prior to Admission medications   Medication Sig Start Date End Date Taking? Authorizing Provider    Family History Family History  Problem Relation Age of Onset   Cancer Mother        Breast   Social History Social History   Tobacco Use   Smoking status: Never   Smokeless tobacco: Never  Vaping Use   Vaping Use: Never used  Substance Use Topics   Alcohol use: Never   Drug use: Never   Allergies   Patient has no known allergies.  Review of Systems Review of Systems Pertinent findings noted in history of present illness.   Physical Exam Triage Vital Signs ED Triage Vitals  Enc Vitals Group     BP 03/03/21 0827 (!) 147/82     Pulse Rate 03/03/21 0827 72     Resp 03/03/21 0827 18     Temp 03/03/21 0827 98.3 F (36.8 C)     Temp Source 03/03/21 0827 Oral     SpO2 03/03/21 0827 98 %     Weight --      Height --      Head Circumference --       Peak Flow --      Pain Score 03/03/21 0826 5     Pain Loc --      Pain Edu? --      Excl. in GC? --   No data found.  Updated Vital Signs BP 115/61 (BP Location: Right Arm) Comment (BP Location): large cuff   Pulse 83    Temp 98.9 F (37.2 C) (Oral)    Resp 20    LMP  (LMP Unknown)    SpO2 100%   Physical Exam Vitals and nursing note reviewed.  Constitutional:      General: She is not in acute distress.    Appearance: Normal appearance. She is not ill-appearing.  HENT:     Head: Normocephalic and atraumatic.  Eyes:     General: Lids are normal.        Right eye: No discharge.  Left eye: No discharge.     Extraocular Movements: Extraocular movements intact.     Conjunctiva/sclera: Conjunctivae normal.     Right eye: Right conjunctiva is not injected.     Left eye: Left conjunctiva is not injected.  Neck:     Trachea: Trachea and phonation normal.  Cardiovascular:     Rate and Rhythm: Normal rate and regular rhythm.     Pulses: Normal pulses.     Heart sounds: Normal heart sounds. No murmur heard.   No friction rub. No gallop.  Pulmonary:     Effort: Pulmonary effort is normal. No accessory muscle usage, prolonged expiration or respiratory distress.     Breath sounds: Normal breath sounds. No stridor, decreased air movement or transmitted upper airway sounds. No decreased breath sounds, wheezing, rhonchi or rales.  Chest:     Chest wall: No tenderness.  Musculoskeletal:        General: Normal range of motion.     Cervical back: Normal range of motion and neck supple. Normal range of motion.  Lymphadenopathy:     Cervical: No cervical adenopathy.  Skin:    General: Skin is warm and dry.     Findings: No erythema or rash.  Neurological:     General: No focal deficit present.     Mental Status: She is alert and oriented to person, place, and time.  Psychiatric:        Mood and Affect: Mood normal.        Behavior: Behavior normal.    Visual Acuity Right  Eye Distance:   Left Eye Distance:   Bilateral Distance:    Right Eye Near:   Left Eye Near:    Bilateral Near:     UC Couse / Diagnostics / Procedures:    EKG  Radiology No results found.  Procedures Procedures (including critical care time)  UC Diagnoses / Final Clinical Impressions(s)   I have reviewed the triage vital signs and the nursing notes.  Pertinent labs & imaging results that were available during my care of the patient were reviewed by me and considered in my medical decision making (see chart for details).    Final diagnoses:  Pregnancy, unspecified gestational age   Pregnant.  Begin prenatal vitamins, follow-up with OB/GYN. ED Prescriptions     Medication Sig Dispense Auth. Provider   Prenatal Vit-Fe Fumarate-FA (PRENATAL PLUS VITAMIN/MINERAL) 27-1 MG TABS Take 1 tablet by mouth daily at 6 (six) AM. 90 tablet Theadora Rama Scales, PA-C      PDMP not reviewed this encounter.  Pending results:  Labs Reviewed  POCT URINALYSIS DIPSTICK, ED / UC - Abnormal; Notable for the following components:      Result Value   Ketones, ur 40 (*)    Hgb urine dipstick LARGE (*)    Protein, ur 30 (*)    Nitrite POSITIVE (*)    Leukocytes,Ua MODERATE (*)    All other components within normal limits  POC URINE PREG, ED - Abnormal; Notable for the following components:   Preg Test, Ur POSITIVE (*)    All other components within normal limits    Medications Ordered in UC: Medications - No data to display  Disposition Upon Discharge:  Condition: stable for discharge home Home: take medications as prescribed; routine discharge instructions as discussed; follow up as advised.  Patient presented with an acute illness with associated systemic symptoms and significant discomfort requiring urgent management. In my opinion, this is a condition that a  prudent lay person (someone who possesses an average knowledge of health and medicine) may potentially expect to result in  complications if not addressed urgently such as respiratory distress, impairment of bodily function or dysfunction of bodily organs.   Routine symptom specific, illness specific and/or disease specific instructions were discussed with the patient and/or caregiver at length.   As such, the patient has been evaluated and assessed, work-up was performed and treatment was provided in alignment with urgent care protocols and evidence based medicine.  Patient/parent/caregiver has been advised that the patient may require follow up for further testing and treatment if the symptoms continue in spite of treatment, as clinically indicated and appropriate.  If the patient was tested for COVID-19, Influenza and/or RSV, then the patient/parent/guardian was advised to isolate at home pending the results of his/her diagnostic coronavirus test and potentially longer if theyre positive. I have also advised pt that if his/her COVID-19 test returns positive, it's recommended to self-isolate for at least 10 days after symptoms first appeared AND until fever-free for 24 hours without fever reducer AND other symptoms have improved or resolved. Discussed self-isolation recommendations as well as instructions for household member/close contacts as per the [facility] and [place] [facility], and also gave patient the COVID packet with this information.  Patient/parent/caregiver has been advised to return to the [facility] or PCP in 3-5 days if no better; to PCP or the Emergency Department if new signs and symptoms develop, or if the current signs or symptoms continue to change or worsen for further workup, evaluation and treatment as clinically indicated and appropriate  The patient will follow up with their current PCP if and as advised. If the patient does not currently have a PCP we will assist them in obtaining one.   The patient may need specialty follow up if the symptoms continue, in spite of conservative treatment and management, for further  workup, evaluation, consultation and treatment as clinically indicated and appropriate.   Patient/parent/caregiver verbalized understanding and agreement of plan as discussed.  All questions were addressed during visit.  Please see discharge instructions below for further details of plan.  Discharge Instructions:   Discharge Instructions      Please begin prenatal vitamins, 1 tablet daily now.  Please reach out to your OB/GYN to schedule your first prenatal appointment which is typically done around the first 6 weeks of pregnancy.  The duration of your pregnancy is calculated based on the first day of your last menstrual period.      This office note has been dictated using Teaching laboratory technician.  Unfortunately, and despite my best efforts, this method of dictation can sometimes lead to occasional typographical or grammatical errors.  I apologize in advance if this occurs.     Theadora Rama Scales, PA-C 05/15/21 1245

## 2021-05-15 NOTE — Discharge Instructions (Signed)
Please begin prenatal vitamins, 1 tablet daily now.  Please reach out to your OB/GYN to schedule your first prenatal appointment which is typically done around the first 6 weeks of pregnancy.  The duration of your pregnancy is calculated based on the first day of your last menstrual period.

## 2021-05-15 NOTE — ED Triage Notes (Signed)
Patient reports feeling bad for 1 1/2 weeks.  Patient reports lower abdominal pain and vomiting.  One episode of vomiting.

## 2021-05-29 IMAGING — US US MFM OB FOLLOW UP
1 series · 14 of 28 positions shown · non-contrast
Comparison: none

[Series 1: us mfm ob follow up · 56 acquisitions, 14 frames shown]
[im 3/56]
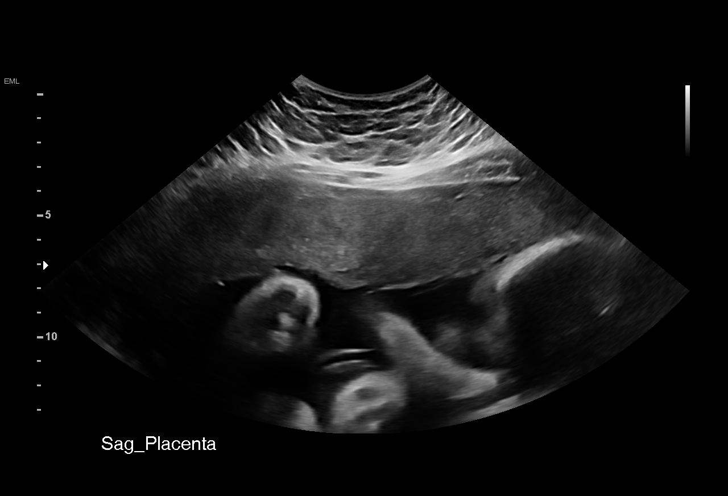
[im 7/56]
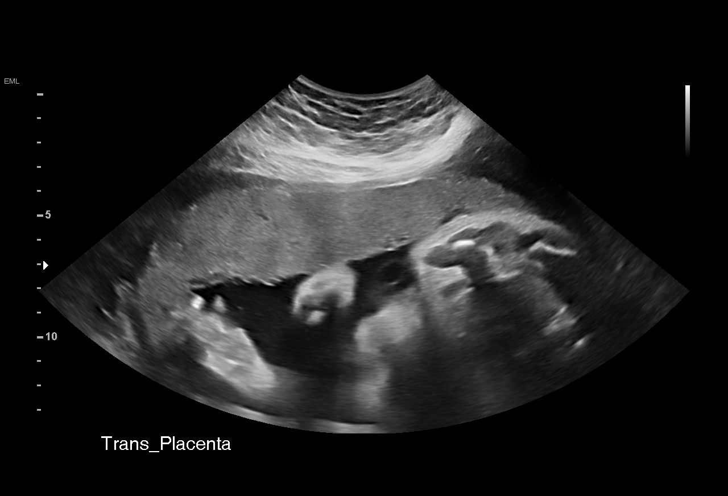
[im 11/56]
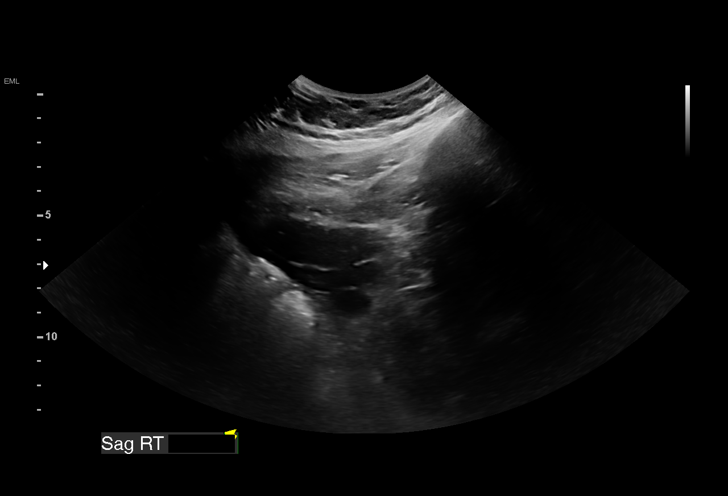
[im 15/56]
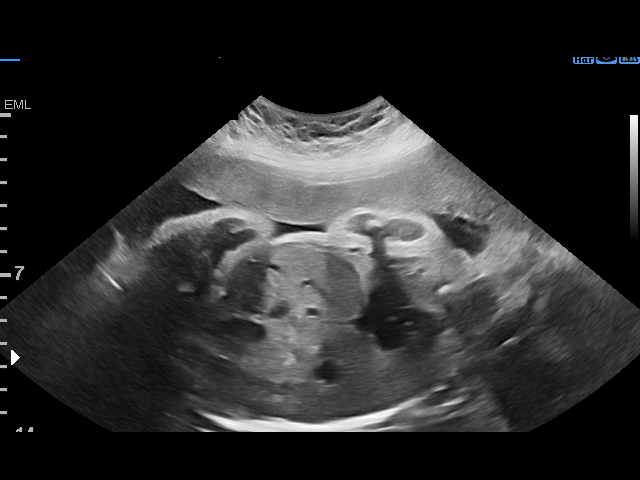
[im 19/56]
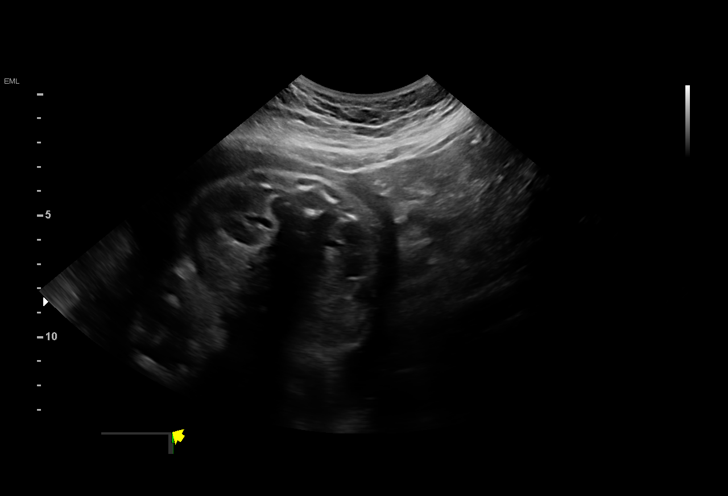
[im 23/56]
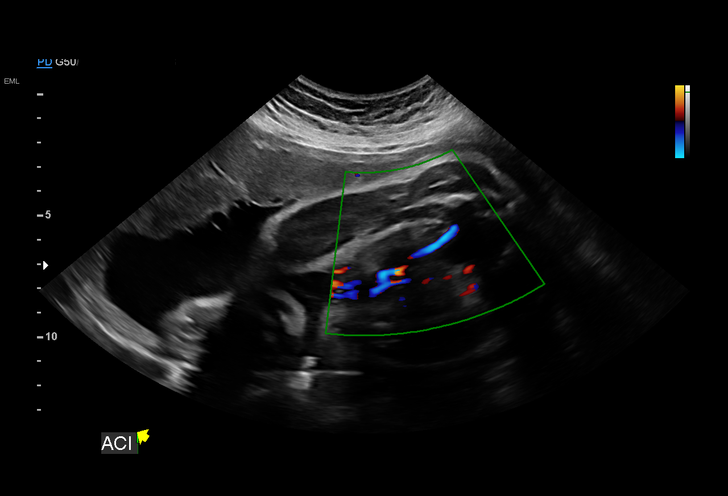
[im 27/56]
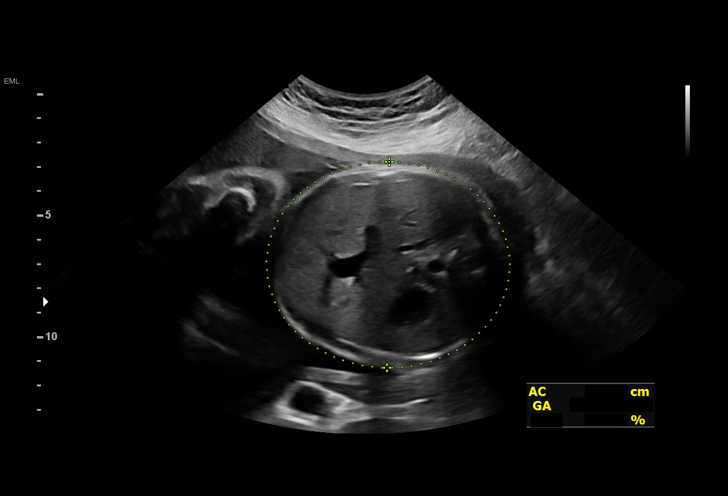
[im 31/56]
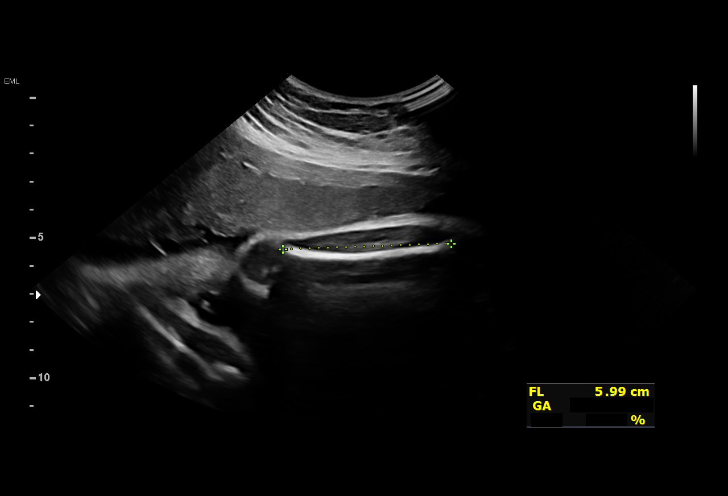
[im 35/56]
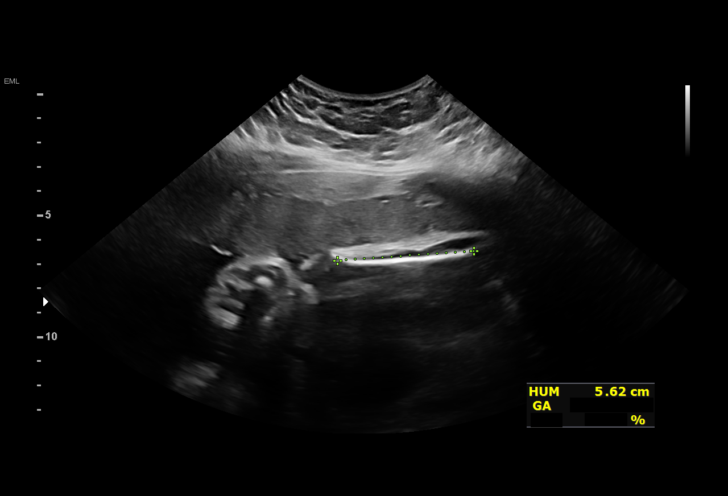
[im 39/56]
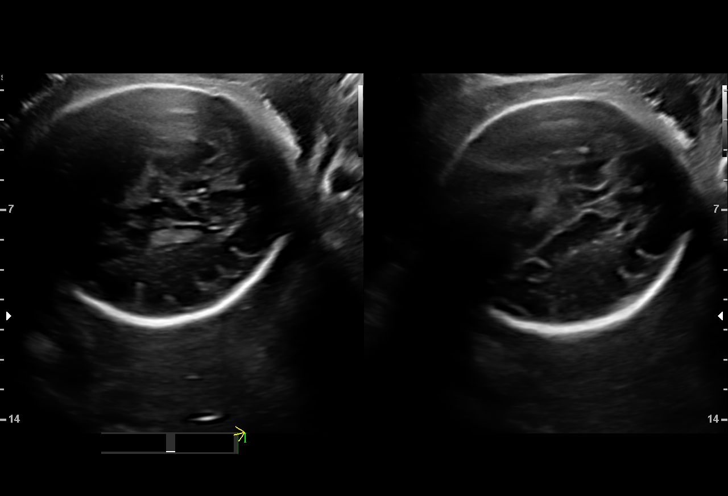
[im 43/56]
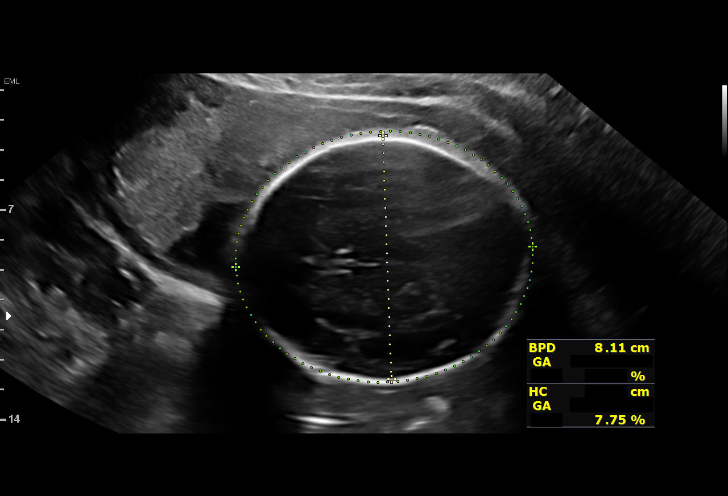
[im 47/56]
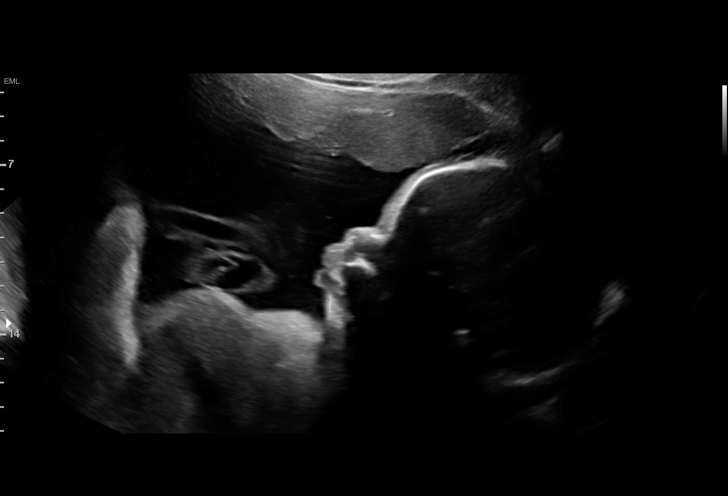
[im 51/56]
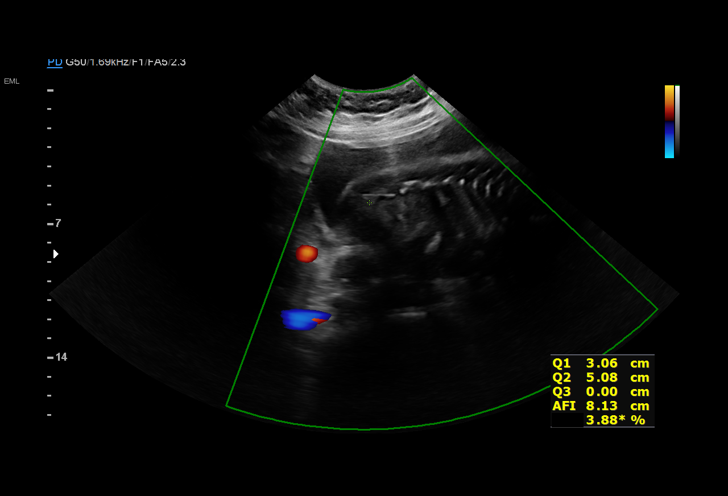
[im 56/56]
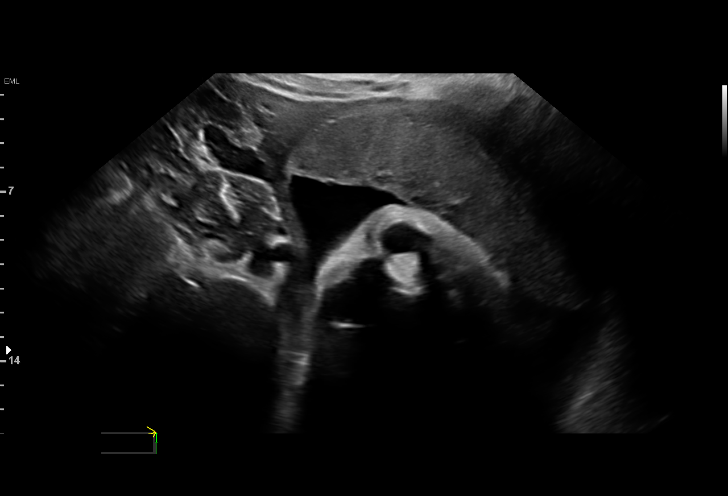

[14 of 28 positions shown; findings below may reference images not displayed]

NASHA
 ----------------------------------------------------------------------

 ----------------------------------------------------------------------
Indications

  32 weeks gestation of pregnancy
  Obesity complicating pregnancy, third
  trimester (pregravid BMI 42) (low risk NIPS,
  neg AFP)
  Encounter for other antenatal screening
  follow-up
 ----------------------------------------------------------------------
Vital Signs

 BMI:
Fetal Evaluation

 Num Of Fetuses:          1
 Fetal Heart Rate(bpm):   138
 Cardiac Activity:        Observed
 Presentation:            Cephalic
 Placenta:                Anterior
 P. Cord Insertion:       Visualized, central

 Amniotic Fluid
 AFI FV:      Within normal limits

 AFI Sum(cm)     %Tile       Largest Pocket(cm)
 11.47           27

 RUQ(cm)       RLQ(cm)       LUQ(cm)        LLQ(cm)
 3.06          3.33          5.08           0
Biometry
 BPD:      81.1  mm     G. Age:  32w 4d         55  %    CI:        79.34   %    70 - 86
                                                         FL/HC:       20.9  %    19.1 -
 HC:      287.8  mm     G. Age:  31w 5d          8  %    HC/AC:       0.99       0.96 -
 AC:      290.8  mm     G. Age:  33w 1d         76  %    FL/BPD:      74.2  %    71 - 87
 FL:       60.2  mm     G. Age:  31w 2d         18  %    FL/AC:       20.7  %    20 - 24
 HUM:      57.8  mm     G. Age:  33w 4d         80  %
 LV:        7.1  mm

 Est. FW:    0020   gm     4 lb 5 oz     61  %
OB History

 Gravidity:    1
Gestational Age

 LMP:           32w 1d        Date:  03/10/18                 EDD:   12/15/18
 U/S Today:     32w 1d                                        EDD:   12/15/18
 Best:          32w 1d     Det. By:  LMP  (03/10/18)          EDD:   12/15/18
Anatomy

 Cranium:               Appears normal         LVOT:                   Previously seen
 Cavum:                 Appears normal         Aortic Arch:            Previously seen
 Ventricles:            Appears normal         Ductal Arch:            Previously seen
 Choroid Plexus:        Appears normal         Diaphragm:              Appears normal
 Cerebellum:            Appears normal         Stomach:                Appears normal, left
                                                                       sided
 Posterior Fossa:       Appears normal         Abdomen:                Appears normal
 Nuchal Fold:           Not applicable (>20    Abdominal Wall:         Appears nml (cord
                        wks GA)                                        insert, abd wall)
 Face:                  Appears normal         Cord Vessels:           Appears normal (3
                        (orbits and profile)                           vessel cord)
 Lips:                  Appears normal         Kidneys:                Appear normal
 Palate:                Not well visualized    Bladder:                Appears normal
 Thoracic:              Appears normal         Spine:                  Previously seen
 Heart:                 Previously seen        Upper Extremities:      Previously seen
 RVOT:                  Previously seen        Lower Extremities:      Previously seen

 Other:  Heels and 5th digit previously seen. Fetus appears to be female.
         Nasal bone visualized.
Cervix Uterus Adnexa

 Cervix
 Not visualized (advanced GA >80wks)

 Left Ovary
 Within normal limits.

 Right Ovary
 Not visualized.

 Adnexa
 No abnormality visualized.
Impression

 Amniotic fluid is normal and good fetal activity is seen. Fetal
 growth is appropriate for gestational age.
Recommendations

 An appointment was made for her to return in 4 weeks for
 fetal growth assessment.
                 Timko, Sumaira

## 2021-06-01 ENCOUNTER — Telehealth (INDEPENDENT_AMBULATORY_CARE_PROVIDER_SITE_OTHER): Payer: Medicaid Other

## 2021-06-01 DIAGNOSIS — Z3480 Encounter for supervision of other normal pregnancy, unspecified trimester: Secondary | ICD-10-CM | POA: Insufficient documentation

## 2021-06-01 DIAGNOSIS — O3680X0 Pregnancy with inconclusive fetal viability, not applicable or unspecified: Secondary | ICD-10-CM

## 2021-06-01 DIAGNOSIS — Z3A00 Weeks of gestation of pregnancy not specified: Secondary | ICD-10-CM

## 2021-06-01 NOTE — Patient Instructions (Signed)
  At our [facility], we work as an integrated team, providing care to address both physical and emotional health. Your medical provider may refer you to see our Behavioral Health Clinician (BHC) on the same day you see your medical provider, as availability permits; often scheduled virtually at your convenience.  Our BHC is available to all patients, visits generally last between 20-30 minutes, but can be longer or shorter, depending on patient need. The BHC offers help with stress management, coping with symptoms of depression and anxiety, major life changes , sleep issues, changing risky behavior, grief and loss, life stress, working on personal life goals, and  behavioral health issues, as these all affect your overall health and wellness.  The BHC is NOT available for the following: FMLA paperwork, court-ordered evaluations, specialty assessments (custody or disability), letters to employers, or obtaining certification for an emotional support animal. The BHC does not provide long-term therapy. You have the right to refuse integrated behavioral health services, or to reschedule to see the BHC at a later date.  Confidentiality exception: If it is suspected that a child or disabled adult is being abused or neglected, we are required by law to report that to either Child Protective Services or Adult Protective Services.  If you have a diagnosis of Bipolar affective disorder, Schizophrenia, or recurrent Major depressive disorder, we will recommend that you establish care with a psychiatrist, as these are lifelong, chronic conditions, and we want your overall emotional health and medications to be more closely monitored. If you anticipate needing extended maternity leave due to mental health issues postpartum, it it recommended you inform your medical provider, so we can put in a referral to a psychiatrist as soon as possible. The BHC is unable to recommend an extended maternity leave for mental  health issues. Your medical provider or BHC may refer you to a therapist for ongoing, traditional therapy, or to a psychiatrist, for medication management, if it would benefit your overall health. Depending on your insurance, you may have a copay or be charged a deductible, depending on your insurance, to see the BHC. If you are uninsured, it is recommended that you apply for financial assistance. (Forms may be requested at the front desk for in-person visits, via MyChart, or request a form during a virtual visit).  If you see the BHC more than 6 times, you will have to complete a comprehensive clinical assessment interview with the BHC to resume integrated services.  For virtual visits with the BHC, you must be physically in the state of [place] at the time of the visit. For example, if you live in [place], you will have to do an in-person visit with the BHC, and your out-of-state insurance may not cover behavioral health services in [place]. If you are going out of the state or country for any reason, the BHC may see you virtually when you return to [place], but not while you are physically outside of [place].    

## 2021-06-01 NOTE — Progress Notes (Signed)
Pt not sure of Dating, the earliest I could get her schedule for an U/S was 06/20/21 @1 :15p. Due date may change.

## 2021-06-01 NOTE — Progress Notes (Signed)
New OB Intake  I connected with  Madeline Garcia on 06/01/21 at 11:15 AM EST by MyChart Video Visit and verified that I am speaking with the correct person using two identifiers. Nurse is located at [facility] and pt is located at home.  I discussed the limitations, risks, security and privacy concerns of performing an evaluation and management service by telephone and the availability of in person appointments. I also discussed with the patient that there may be a patient responsible charge related to this service. The patient expressed understanding and agreed to proceed.  I explained I am completing New OB Intake today. We discussed her EDD of 10/02/21 that is based on LMP of 12/26/20. Pt is G2/P1. I reviewed her allergies, medications, Medical/Surgical/OB history, and appropriate screenings. I informed her of [facility] services. Based on history, this is a/an  pregnancy uncomplicated .   Patient Active Problem List   Diagnosis Date Noted   Supervision of other normal pregnancy, antepartum 06/01/2021   Tinea versicolor 02/04/2019   Birth control counseling 02/04/2019    Concerns addressed today  Delivery Plans:  Plans to deliver at [facility]   MyChart/Babyscripts MyChart access verified. I explained pt will have some visits in office and some virtually. Babyscripts instructions given and order placed. Patient verifies receipt of registration text/e-mail. Account successfully created and app downloaded.  Blood Pressure Cuff  Blood pressure cuff ordered for patient to pick-up from [employer]. Explained after first prenatal appt pt will check weekly and document in Babyscripts. Pt has own BP Cuff.  Weight scale: Patient does / does not  have weight scale. Weight scale ordered for patient to pick up from [employer].   Anatomy US Explained first scheduled [place] will be around 19 weeks. Anatomy US scheduled for 06/20/21 at 01:30p. Pt notified to arrive at 01:15p.  Labs Discussed Avelina Laine  genetic screening with patient. Would like both Panorama and Horizon drawn at new OB visit. Routine prenatal labs needed.  Covid Vaccine Patient has not covid vaccine.   CenteringPregnancy Candidate?  If yes, offer as possibility  Mother/ Baby Dyad Candidate?    If yes, offer as possibility  Informed patient of [facility] website  and placed link in her AVS.   Social Determinants of Health Food Insecurity: Patient denies food insecurity. WIC Referral: Patient is interested in referral to [facility].  Transportation: Patient denies transportation needs. Childcare: Discussed no children allowed at ultrasound appointments. Offered childcare services; patient declines childcare services at this time.  Send link to Pregnancy Navigators   Placed OB Box on problem list and updated  First visit review I reviewed new OB appt with pt. I explained she will have a pelvic exam, ob bloodwork with genetic screening, and PAP smear. Explained pt will be seen by Dr. Vergie Living at first visit; encounter routed to appropriate provider. Explained that patient will be seen by pregnancy navigator following visit with provider. [facility] information placed in AVS.   Henrietta Dine, CMA 06/01/2021  11:55 AM

## 2021-06-12 ENCOUNTER — Ambulatory Visit (INDEPENDENT_AMBULATORY_CARE_PROVIDER_SITE_OTHER): Payer: Medicaid Other | Admitting: Family Medicine

## 2021-06-12 ENCOUNTER — Other Ambulatory Visit: Payer: Self-pay

## 2021-06-12 ENCOUNTER — Encounter: Payer: Self-pay | Admitting: *Deleted

## 2021-06-12 ENCOUNTER — Other Ambulatory Visit (HOSPITAL_COMMUNITY)
Admission: RE | Admit: 2021-06-12 | Discharge: 2021-06-12 | Disposition: A | Discharge status: Home / Self Care | Payer: Medicaid Other | Source: Ambulatory Visit | Attending: Advanced Practice Midwife | Admitting: Advanced Practice Midwife

## 2021-06-12 VITALS — BP 113/79 | HR 102 | Wt 253.0 lb

## 2021-06-12 DIAGNOSIS — Z3480 Encounter for supervision of other normal pregnancy, unspecified trimester: Secondary | ICD-10-CM | POA: Insufficient documentation

## 2021-06-12 DIAGNOSIS — Z6841 Body Mass Index (BMI) 40.0 and over, adult: Secondary | ICD-10-CM

## 2021-06-12 DIAGNOSIS — Z8759 Personal history of other complications of pregnancy, childbirth and the puerperium: Secondary | ICD-10-CM

## 2021-06-12 DIAGNOSIS — Z3687 Encounter for antenatal screening for uncertain dates: Secondary | ICD-10-CM

## 2021-06-12 NOTE — Progress Notes (Signed)
Unable to find FHR, will check with bedside US.

## 2021-06-12 NOTE — Progress Notes (Signed)
History:   Madeline Garcia is a 26 y.o. [name] at [redacted]w[redacted]d by unsure LMP being seen today for her first obstetrical visit.  Her obstetrical history is significant for  one vaginal delivery, vacuum assistance for fetal HR indication . Otherwise denied any previous pregnancy complications. Patient does intend to breast feed. Pregnancy history fully reviewed.  Patient endorses occasional headaches. Usually bilateral temples and throbbing in nature. About every few weeks and generally resolve with sleeping. Has not tried any medication. No extremity weakness/numbness, blurred vision, lightheadedness/dizziness.   She is very unsure of her LMP, however had reported 8/22 during RN intake visit. She believes she had a menstrual cycle between if 8-02/2021 but can't remember which month or week. She was using Depo injections for contraception, last took in 09/2020 and missed her 12/2020 dose. Her periods were irregular.      HISTORY: OB History  Gravida Para Term Preterm AB Living  2 1 1  0 0 1  SAB IAB Ectopic Multiple Live Births  0 0 0 0 1    # Outcome Date GA Lbr Len/2nd Weight Sex Delivery Anes PTL Lv  2 Current           1 Term 12/23/18 [redacted]w[redacted]d 10:03 / 01:29 7 lb 15.3 oz (3.61 kg) F Vag-Vacuum EPI  LIV     Name: DEMIANA, CRUMBLEY     Apgar1: 7  Apgar5: 9    Last pap smear was done 06/2018 and was normal  Past Medical History:  Diagnosis Date   Medical history non-contributory    Past Surgical History:  Procedure Laterality Date   NO PAST SURGERIES     Family History  Problem Relation Age of Onset   Cancer Mother        Breast   Social History   Tobacco Use   Smoking status: Never   Smokeless tobacco: Never  Vaping Use   Vaping Use: Never used  Substance Use Topics   Alcohol use: Never   Drug use: Never   No Known Allergies Current Outpatient Medications on File Prior to Visit  Medication Sig Dispense Refill   Prenatal Vit-Fe Fumarate-FA (PRENATAL PLUS  VITAMIN/MINERAL) 27-1 MG TABS Take 1 tablet by mouth daily at 6 (six) AM. 90 tablet 0   No current facility-administered medications on file prior to visit.   Indications for ASA therapy (per uptodate) One of the following: Previous pregnancy with preeclampsia, especially early onset and with an adverse outcome No Multifetal gestation No Chronic hypertension No Type 1 or 2 diabetes mellitus No Chronic kidney disease No Autoimmune disease (antiphospholipid syndrome, systemic lupus erythematosus) No  Two or more of the following: Nulliparity No Obesity (body mass index >30 kg/m2) Yes Family history of preeclampsia in mother or sister No Age ?35 years No Sociodemographic characteristics (African American race, low socioeconomic level) Yes Personal risk factors (eg, previous pregnancy with low birth weight or small for gestational age infant, previous adverse pregnancy outcome [eg, stillbirth], interval >10 years between pregnancies) No   Review of Systems Pertinent items noted in HPI and remainder of comprehensive ROS otherwise negative. Physical Exam:   Vitals:   06/12/21 1515  BP: 113/79  Pulse: (!) 102  Weight: 253 lb (114.8 kg)     Bedside Ultrasound for FHR check: Viable intrauterine pregnancy with positive cardiac activity note, fetal heart rate appeared around 150bpm. Fetus did appear smaller than would be expected for [redacted] weeks gestation.  Patient informed that the ultrasound is considered a limited  obstetric ultrasound and is not intended to be a complete ultrasound exam.  Patient also informed that the ultrasound is not being completed with the intent of assessing for fetal or placental anomalies or any pelvic abnormalities.  Explained that the purpose of todays ultrasound is to assess for fetal heart rate.  Patient acknowledges the purpose of the exam and the limitations of the study.  Constitutional: Well-developed, well-nourished pregnant female in no acute distress.   HEENT: PERRLA Skin: normal color and turgor, no rash Cardiovascular: normal rate Respiratory: normal effort GI: Abd soft, non-tender MS: Extremities nontender, no edema, normal ROM Neurologic: Alert and oriented x 4.  GU: no CVA tenderness  Assessment:    Pregnancy: [name] Patient Active Problem List   Diagnosis Date Noted   Supervision of other normal pregnancy, antepartum 06/01/2021   Tinea versicolor 02/04/2019     Plan:    1. Supervision of other normal pregnancy, antepartum Doing well. Will start ASA.  - Hemoglobin A1c - CHL AMB BABYSCRIPTS SCHEDULE OPTIMIZATION - Culture, OB Urine - CBC/D/Plt+RPR+Rh+ABO+RubIgG... - Genetic Screening - GC/Chlamydia probe amp ([facility])not at [facility]  2. BMI 40.0-44.9, adult (HCC) BMI 43 today. Plan for growth [place] as her pregnancy continues.   3. Unsure of last menstrual period as reason for ultrasound scan [place] was already scheduled for 2/14 for anatomy, however suspect that patient is not as far along as stated by very unsure LMP. Will need to use [place] next week for dating.   4. History of vacuum extraction assisted delivery Fetal HR indication.     Initial labs drawn. Continue prenatal vitamins. Problem list reviewed and updated. Genetic Screening discussed and ordered Ultrasound discussed; fetal anatomic survey: scheduled for 2/14  Anticipatory guidance about prenatal visits given including labs, ultrasounds, and testing. Discussed usage of Babys[employer]s and virtual visits as additional source of managing and completing prenatal visits in midst of coronavirus and pandemic.   Encouraged to complete MyChart Registration for her ability to review results, send requests, and have questions addressed.  The nature of [facility] with multiple MDs and Advanced Practice Providers was explained to patient; also emphasized that residents, students are part of our team. Routine obstetric  precautions reviewed. Encouraged to seek out care at office or emergency room [facility] MAU preferred) for urgent and/or emergent concerns.  Return in about 4 weeks (around 07/10/2021) for LROB with GTT.     Leticia Penna, DO Ob Fellow   06/13/2021  10:33 AM

## 2021-06-12 NOTE — Patient Instructions (Signed)
Tylenol up to 1000mg  at one time. You can also put a cool or hot washcloth

## 2021-06-13 ENCOUNTER — Telehealth: Payer: Self-pay

## 2021-06-13 ENCOUNTER — Encounter: Payer: Self-pay | Admitting: Family Medicine

## 2021-06-13 DIAGNOSIS — Z6841 Body Mass Index (BMI) 40.0 and over, adult: Secondary | ICD-10-CM | POA: Insufficient documentation

## 2021-06-13 DIAGNOSIS — Z8759 Personal history of other complications of pregnancy, childbirth and the puerperium: Secondary | ICD-10-CM | POA: Insufficient documentation

## 2021-06-13 LAB — CBC/D/PLT+RPR+RH+ABO+RUBIGG...
Antibody Screen: NEGATIVE
Basophils Absolute: 0.1 10*3/uL (ref 0.0–0.2)
Basos: 1 %
EOS (ABSOLUTE): 0.1 10*3/uL (ref 0.0–0.4)
Eos: 1 %
HCV Ab: <0.1 s/co ratio (ref 0.0–0.9)
HIV Screen 4th Generation wRfx: NONREACTIVE
Hematocrit: 34.8 % (ref 34.0–46.6)
Hemoglobin: 11.8 g/dL (ref 11.1–15.9)
Hepatitis B Surface Ag: NEGATIVE
Immature Grans (Abs): 0 10*3/uL (ref 0.0–0.1)
Immature Granulocytes: 0 %
Lymphocytes Absolute: 2.6 10*3/uL (ref 0.7–3.1)
Lymphs: 30 %
MCH: 26.9 pg (ref 26.6–33.0)
MCHC: 33.9 g/dL (ref 31.5–35.7)
MCV: 79 fL (ref 79–97)
Monocytes Absolute: 0.8 10*3/uL (ref 0.1–0.9)
Monocytes: 10 %
Neutrophils Absolute: 5 10*3/uL (ref 1.4–7.0)
Neutrophils: 58 %
Platelets: 358 10*3/uL (ref 150–450)
RBC: 4.39 x10E6/uL (ref 3.77–5.28)
RDW: 14.3 % (ref 11.7–15.4)
RPR Ser Ql: NONREACTIVE
Rh Factor: POSITIVE
Rubella Antibodies, IGG: 1.1 index (ref >0.99)
WBC: 8.6 10*3/uL (ref 3.4–10.8)

## 2021-06-13 LAB — HCV INTERPRETATION

## 2021-06-13 LAB — GC/CHLAMYDIA PROBE AMP (~~LOC~~) NOT AT ARMC
Chlamydia: NEGATIVE
Comment: NEGATIVE
Comment: NORMAL
Neisseria Gonorrhea: NEGATIVE

## 2021-06-13 LAB — HEMOGLOBIN A1C
Est. average glucose Bld gHb Est-mCnc: 120 mg/dL
Hgb A1c MFr Bld: 5.8 % — ABNORMAL HIGH (ref 4.8–5.6)

## 2021-06-13 NOTE — Telephone Encounter (Addendum)
-----   Message from Allayne Stack, DO sent at 06/13/2021 10:48 AM EST ----- Can we please get her scheduled for a lab visit to do an early 2 hr gtt?   Thank you Dr Annia Friendly    Per chart review, pt was scheduled for 2 HR GTT with next routine OB appt. Will need sooner appt for 2 hr gtt due to elevated a1c. Called pt; VM left stating I am calling patient with results and patient should check MyChart to view results. Message sent to patient.

## 2021-06-15 LAB — URINE CULTURE, OB REFLEX

## 2021-06-15 LAB — CULTURE, OB URINE

## 2021-06-16 ENCOUNTER — Other Ambulatory Visit: Payer: Self-pay | Admitting: Family Medicine

## 2021-06-16 DIAGNOSIS — O99891 Other specified diseases and conditions complicating pregnancy: Secondary | ICD-10-CM

## 2021-06-16 DIAGNOSIS — R8271 Bacteriuria: Secondary | ICD-10-CM

## 2021-06-16 MED ORDER — CEFADROXIL 500 MG PO CAPS: 500.0000 mg | ORAL_CAPSULE | Freq: Two times a day (BID) | ORAL | 0 refills | Status: DC

## 2021-06-19 ENCOUNTER — Encounter: Payer: Medicaid Other | Admitting: Obstetrics and Gynecology

## 2021-06-20 ENCOUNTER — Other Ambulatory Visit: Payer: Self-pay

## 2021-06-20 ENCOUNTER — Other Ambulatory Visit: Payer: Self-pay | Admitting: Obstetrics and Gynecology

## 2021-06-20 ENCOUNTER — Ambulatory Visit: Payer: Medicaid Other | Admitting: *Deleted

## 2021-06-20 ENCOUNTER — Ambulatory Visit: Payer: Medicaid Other | Attending: Obstetrics and Gynecology

## 2021-06-20 VITALS — BP 120/69 | HR 97

## 2021-06-20 DIAGNOSIS — Z3687 Encounter for antenatal screening for uncertain dates: Secondary | ICD-10-CM | POA: Insufficient documentation

## 2021-06-20 DIAGNOSIS — Z3480 Encounter for supervision of other normal pregnancy, unspecified trimester: Secondary | ICD-10-CM

## 2021-06-20 DIAGNOSIS — Z3A12 12 weeks gestation of pregnancy: Secondary | ICD-10-CM | POA: Insufficient documentation

## 2021-06-20 DIAGNOSIS — O99211 Obesity complicating pregnancy, first trimester: Secondary | ICD-10-CM | POA: Diagnosis not present

## 2021-06-20 DIAGNOSIS — Z363 Encounter for antenatal screening for malformations: Secondary | ICD-10-CM | POA: Insufficient documentation

## 2021-06-21 ENCOUNTER — Other Ambulatory Visit: Payer: Self-pay

## 2021-06-28 ENCOUNTER — Encounter: Payer: Self-pay | Admitting: *Deleted

## 2021-07-05 ENCOUNTER — Inpatient Hospital Stay (HOSPITAL_COMMUNITY)
Admission: AD | Admit: 2021-07-05 | Discharge: 2021-07-05 | Disposition: A | Discharge status: Home / Self Care | Payer: Medicaid Other | Attending: Obstetrics and Gynecology | Admitting: Obstetrics and Gynecology

## 2021-07-05 ENCOUNTER — Other Ambulatory Visit: Payer: Self-pay

## 2021-07-05 DIAGNOSIS — O23592 Infection of other part of genital tract in pregnancy, second trimester: Secondary | ICD-10-CM | POA: Insufficient documentation

## 2021-07-05 DIAGNOSIS — O4690 Antepartum hemorrhage, unspecified, unspecified trimester: Secondary | ICD-10-CM | POA: Diagnosis not present

## 2021-07-05 DIAGNOSIS — B9689 Other specified bacterial agents as the cause of diseases classified elsewhere: Secondary | ICD-10-CM

## 2021-07-05 DIAGNOSIS — Z3A14 14 weeks gestation of pregnancy: Secondary | ICD-10-CM | POA: Diagnosis not present

## 2021-07-05 DIAGNOSIS — O209 Hemorrhage in early pregnancy, unspecified: Secondary | ICD-10-CM | POA: Diagnosis present

## 2021-07-05 DIAGNOSIS — N760 Acute vaginitis: Secondary | ICD-10-CM

## 2021-07-05 LAB — CBC
HCT: 34.4 % — ABNORMAL LOW (ref 36.0–46.0)
Hemoglobin: 11.2 g/dL — ABNORMAL LOW (ref 12.0–15.0)
MCH: 26.9 pg (ref 26.0–34.0)
MCHC: 32.6 g/dL (ref 30.0–36.0)
MCV: 82.7 fL (ref 80.0–100.0)
Platelets: 340 10*3/uL (ref 150–400)
RBC: 4.16 MIL/uL (ref 3.87–5.11)
RDW: 13.7 % (ref 11.5–15.5)
WBC: 9.4 10*3/uL (ref 4.0–10.5)
nRBC: 0 % (ref 0.0–0.2)

## 2021-07-05 LAB — GC/CHLAMYDIA PROBE AMP (~~LOC~~) NOT AT ARMC
Chlamydia: NEGATIVE
Comment: NEGATIVE
Comment: NORMAL
Neisseria Gonorrhea: NEGATIVE

## 2021-07-05 LAB — WET PREP, GENITAL
Clue Cells Wet Prep HPF POC: NONE SEEN
Sperm: NONE SEEN
Trich, Wet Prep: NONE SEEN
WBC, Wet Prep HPF POC: >=10 — AB (ref <10)
Yeast Wet Prep HPF POC: NONE SEEN

## 2021-07-05 MED ORDER — METRONIDAZOLE 500 MG PO TABS
500.0000 mg | ORAL_TABLET | Freq: Two times a day (BID) | ORAL | 0 refills | Status: DC
Start: 2021-07-05 — End: 2021-11-08

## 2021-07-05 NOTE — MAU Provider Note (Signed)
?History  ?  ? ?CSN: [id number] ? ?Arrival date and time: 07/05/21 0311 ? ? Event Date/Time  ? First Provider Initiated Contact with Patient 07/05/21 0403   ?  ? ?Chief Complaint  ?Patient presents with  ? Vaginal Bleeding  ? ?HPI ?Madeline Garcia is a 26 y.o. [name] at [redacted]w[redacted]d. She presents to MAU from [facility] for evaluation of vaginal bleeding, new onset today. Patient endorses seeing a smear of blood when she wiped after voiding. She is not sure if she has continued to bleed. She has not seen blood on her underwear or pants.  ? ?Patient also c/o mild cramping across the top of her abdomen. This is a new problem, onset tonight. Pain score is 3/10. She denies aggravating or alleviating factors. She has not taken medication for this complaint. ? ?She denies dysuria, abnormal vaginal discharge, fever or recent illness. She is remote from sexual intercourse. ? ?OB History   ? ? Gravida  ?2  ? Para  ?1  ? Term  ?1  ? Preterm  ?   ? AB  ?   ? Living  ?1  ?  ? ? SAB  ?   ? IAB  ?   ? Ectopic  ?   ? Multiple  ?0  ? Live Births  ?1  ?   ?  ?  ? ? ?Past Medical History:  ?Diagnosis Date  ? Medical history non-contributory   ? ? ?Past Surgical History:  ?Procedure Laterality Date  ? NO PAST SURGERIES    ? ? ?Family History  ?Problem Relation Age of Onset  ? Cancer Mother   ?     Breast  ? ? ?Social History  ? ?Tobacco Use  ? Smoking status: Never  ? Smokeless tobacco: Never  ?Vaping Use  ? Vaping Use: Never used  ?Substance Use Topics  ? Alcohol use: Never  ? Drug use: Never  ? ? ?Allergies: No Known Allergies ? ?Medications Prior to Admission  ?Medication Sig Dispense Refill Last Dose  ? Prenatal Vit-Fe Fumarate-FA (PRENATAL PLUS VITAMIN/MINERAL) 27-1 MG TABS Take 1 tablet by mouth daily at 6 (six) AM. 90 tablet 0   ? ? ?Review of Systems  ?Gastrointestinal:  Positive for abdominal pain.  ?Genitourinary:  Positive for vaginal bleeding.  ?All other systems reviewed and are negative. ?Physical Exam  ? ?Blood pressure 137/86,  pulse (!) 108, temperature 98.6 ?F (37 ?C), temperature source Oral, resp. rate 17, height 5\' 4"  (1.626 m), weight 118.5 kg, last menstrual period 12/26/2020, SpO2 100 %. ? ?Physical Exam ?Vitals and nursing note reviewed. Exam conducted with a chaperone present.  ?Constitutional:   ?   Appearance: Normal appearance.  ?Cardiovascular:  ?   Rate and Rhythm: Normal rate.  ?   Pulses: Normal pulses.  ?Pulmonary:  ?   Effort: Pulmonary effort is normal.  ?Genitourinary: ?   Comments: Pelvic exam: External genitalia normal, vaginal walls pink and well rugated, cervix visually closed, no lesions noted. Pervasive thin, white foul-smelling discharge observed at introitus and throughout vaginal vault on speculum exam. No bleeding or blood-tinged discharge noted. ? ? ?Skin: ?   Capillary Refill: Capillary refill takes less than 2 seconds.  ?Neurological:  ?   Mental Status: She is alert and oriented to person, place, and time.  ?Psychiatric:     ?   Mood and Affect: Mood normal.     ?   Thought Content: Thought content normal.     ?  Judgment: Judgment normal.  ? ? ?MAU Course  ?Procedures ? ?MDM ? ?--Wet prep negative for Bacterial Vaginosis. Will treat for Bacterial Vaginosis based multiple Amsel Criteria on physical exam ?--Posterior placenta seen 06/20/2021 ? ?Orders Placed This Encounter  ?Procedures  ? Wet prep, genital  ? CBC  ? Nursing communication  ? Discharge patient  ? ?Results for orders placed or performed during the hospital encounter of 07/05/21 (from the past 24 hour(s))  ?CBC     Status: Abnormal  ? Collection Time: 07/05/21  4:15 AM  ?Result Value Ref Range  ? WBC 9.4 4.0 - 10.5 K/uL  ? RBC 4.16 3.87 - 5.11 MIL/uL  ? Hemoglobin 11.2 (L) 12.0 - 15.0 g/dL  ? HCT 34.4 (L) 36.0 - 46.0 %  ? MCV 82.7 80.0 - 100.0 fL  ? MCH 26.9 26.0 - 34.0 pg  ? MCHC 32.6 30.0 - 36.0 g/dL  ? RDW 13.7 11.5 - 15.5 %  ? Platelets 340 150 - 400 K/uL  ? nRBC 0.0 0.0 - 0.2 %  ?Wet prep, genital     Status: Abnormal  ? Collection Time:  07/05/21  4:29 AM  ?Result Value Ref Range  ? Yeast Wet Prep HPF POC NONE SEEN NONE SEEN  ? Trich, Wet Prep NONE SEEN NONE SEEN  ? Clue Cells Wet Prep HPF POC NONE SEEN NONE SEEN  ? WBC, Wet Prep HPF POC >=10 (A) <10  ? Sperm NONE SEEN   ? ?Meds ordered this encounter  ?Medications  ? metroNIDAZOLE (FLAGYL) 500 MG tablet  ?  Sig: Take 1 tablet (500 mg total) by mouth 2 (two) times daily.  ?  Dispense:  14 tablet  ?  Refill:  0  ?  Order Specific Question:   Supervising Provider  ?  AnswerRandolph Bing [[id number]]  ? ? ?Assessment and Plan  ?--26 y.o. [name] at [redacted]w[redacted]d  ?--FHT 158 by Doppler ?--Bacterial Vaginosis ?--No bleeding observed on exam ?--GC/C swab in work ?--Hgb 11.2, blood type B POS ?--Pain resolved without intervention ?--Discharge home in stable condition ? ?Calvert Cantor, CNM ?07/05/2021, 6:44 AM  ?

## 2021-07-05 NOTE — MAU Note (Addendum)
Pt presents to MAU from ED. Pt states that she woke up and went to bathroom, she wiped and something "was not normal".  She is unsure of the color and whether it was blood. She states "it was not normal so I came in". Pt is wearing a pad but is unsure if it is saturated. Pain = 2-3/10.  ?

## 2021-07-05 NOTE — ED Provider Triage Note (Signed)
Emergency Medicine Provider OB Triage Evaluation Note ? ?Madeline Garcia is a 26 y.o. female, [name], at [redacted]w[redacted]d gestation who presents to the emergency department with complaints of vaginal bleeding.  Patient reports that she went to use the bathroom tonight and wiped and saw blood.  She has not had any bleeding previously during this pregnancy.  Reports some mild upper abdominal cramping but otherwise denies pain.  ? ?Review of  Systems  ?Positive: Vaginal bleeding, upper abdominal pain ?Negative: Pelvic pain, vomiting, fever ? ?Physical Exam  ?BP 137/86 (BP Location: Right Arm)   Pulse (!) 108   Temp 98.6 ?F (37 ?C) (Oral)   Resp 17   LMP 12/26/2020   SpO2 100%  ?General: Awake, no distress  ?HEENT: Atraumatic  ?Resp: Normal effort  ?Cardiac: Normal rate ?Abd: Nondistended, nontender  ?MSK: Moves all extremities without difficulty ?Neuro: Speech clear ? ?Medical Decision Making  ?Pt evaluated for pregnancy concern and is stable for transfer to MAU. Pt is in agreement with plan for transfer. ? ?3:16 AM Discussed with MAU APP, Sam, who accepts patient in transfer. ? ?Clinical Impression  ? ?1. Vaginal bleeding during pregnancy   ? ? ? ?  ?Dartha Lodge, PA-C ?07/05/21 0335 ? ?

## 2021-07-06 ENCOUNTER — Other Ambulatory Visit: Payer: Self-pay | Admitting: General Practice

## 2021-07-06 DIAGNOSIS — Z3480 Encounter for supervision of other normal pregnancy, unspecified trimester: Secondary | ICD-10-CM

## 2021-07-10 ENCOUNTER — Other Ambulatory Visit: Payer: Self-pay

## 2021-07-10 ENCOUNTER — Ambulatory Visit (INDEPENDENT_AMBULATORY_CARE_PROVIDER_SITE_OTHER): Payer: Medicaid Other | Admitting: Advanced Practice Midwife

## 2021-07-10 ENCOUNTER — Other Ambulatory Visit: Payer: Medicaid Other

## 2021-07-10 VITALS — BP 117/73 | HR 94 | Wt 264.0 lb

## 2021-07-10 DIAGNOSIS — Z3480 Encounter for supervision of other normal pregnancy, unspecified trimester: Secondary | ICD-10-CM

## 2021-07-10 DIAGNOSIS — R8271 Bacteriuria: Secondary | ICD-10-CM

## 2021-07-10 DIAGNOSIS — R7303 Prediabetes: Secondary | ICD-10-CM

## 2021-07-10 DIAGNOSIS — O99891 Other specified diseases and conditions complicating pregnancy: Secondary | ICD-10-CM

## 2021-07-10 NOTE — Progress Notes (Signed)
? ?  PRENATAL VISIT NOTE ? ?Subjective:  ?Madeline Garcia is a 26 y.o. [name] at [redacted]w[redacted]d being seen today for ongoing prenatal care.  She is currently monitored for the following issues for this low-risk pregnancy and has Tinea versicolor; Supervision of other normal pregnancy, antepartum; BMI 40.0-44.9, adult (HCC); and History of vacuum extraction assisted delivery on their problem list. ? ?Patient reports no complaints.  Contractions: Not present. Vag. Bleeding: None.  Movement: Absent. Denies leaking of fluid.  ? ?Patient denies urinary complaints. She remembers being told she had a UTI in February but states she never initiated treatments. ? ?The following portions of the patient's history were reviewed and updated as appropriate: allergies, current medications, past family history, past medical history, past social history, past surgical history and problem list. Problem list updated. ? ?Objective:  ? ?Vitals:  ? 07/10/21 0838  ?BP: 117/73  ?Pulse: 94  ?Weight: 264 lb (119.7 kg)  ? ? ?Fetal Status: Fetal Heart Rate (bpm): 153   Movement: Absent    ? ?General:  Alert, oriented and cooperative. Patient is in no acute distress.  ?Skin: Skin is warm and dry. No rash noted.   ?Cardiovascular: Normal heart rate noted  ?Respiratory: Normal respiratory effort, no problems with respiration noted  ?Abdomen: Soft, gravid, appropriate for gestational age.  Pain/Pressure: Present     ?Pelvic: Cervical exam deferred        ?Extremities: Normal range of motion.  Edema: None  ?Mental Status: Normal mood and affect. Normal behavior. Normal judgment and thought content.  ? ?Assessment and Plan:  ?Pregnancy: [name] at [redacted]w[redacted]d ? ?1. Supervision of other normal pregnancy, antepartum ?- Routine care, reviewed typical physiologic changes and patient symptoms in second trimester ?- [place] MFM OB DETAIL +14 WK; Future ? ?2. Asymptomatic bacteriuria during pregnancy ?- Untreated ?- Asymptomatic ?- Will collect urine culture today ? ?3.  Prediabetes ?- early GTT today ? ?Preterm labor symptoms and general obstetric precautions including but not limited to vaginal bleeding, contractions, leaking of fluid and fetal movement were reviewed in detail with the patient. ?Please refer to After Visit Summary for other counseling recommendations.  ?Return in about 4 weeks (around 08/07/2021). ? ?Future Appointments  ?Date Time Provider Department Center  ?07/10/2021  8:55 AM Calvert Cantor, CNM [facility]  ?08/07/2021  8:45 AM [facility] NURSE [facility]  ?08/07/2021  9:00 AM [facility] US1 [facility]  ? ? ?Calvert Cantor, CNM ? ?

## 2021-07-11 LAB — GLUCOSE TOLERANCE, 2 HOURS W/ 1HR
Glucose, 1 hour: 122 mg/dL (ref 70–179)
Glucose, 2 hour: 83 mg/dL (ref 70–152)
Glucose, Fasting: 88 mg/dL (ref 70–91)

## 2021-08-07 ENCOUNTER — Ambulatory Visit: Payer: Medicaid Other | Attending: Advanced Practice Midwife

## 2021-08-07 ENCOUNTER — Encounter: Payer: Medicaid Other | Admitting: Medical

## 2021-08-07 ENCOUNTER — Ambulatory Visit: Payer: Medicaid Other

## 2021-08-28 ENCOUNTER — Encounter (HOSPITAL_COMMUNITY): Payer: Self-pay

## 2021-08-28 ENCOUNTER — Ambulatory Visit (HOSPITAL_COMMUNITY)
Admission: EM | Admit: 2021-08-28 | Discharge: 2021-08-28 | Disposition: A | Discharge status: Home / Self Care | Payer: Medicaid Other | Attending: Internal Medicine | Admitting: Internal Medicine

## 2021-08-28 DIAGNOSIS — J069 Acute upper respiratory infection, unspecified: Secondary | ICD-10-CM | POA: Diagnosis not present

## 2021-08-28 MED ORDER — GUAIFENESIN ER 1200 MG PO TB12: 1200.0000 mg | ORAL_TABLET | Freq: Two times a day (BID) | ORAL | 0 refills | Status: DC

## 2021-08-28 NOTE — ED Triage Notes (Signed)
Pt states sinus drainage, coughing and headaches for the past week.  Pt states she is 5 months pregnant. ?

## 2021-08-28 NOTE — Discharge Instructions (Addendum)
You were seen in urgent care today for a viral upper respiratory infection.  Please take guaifenesin 1200 mg twice daily as prescribed.  As discussed, you need to drink water with this medication in order for it to work in your body to thin out the mucus so that you can cough it up and blow it out of in your nose easier.  You may also use saline nasal spray to loosen your mucus. ? ?You may take Tylenol at home for your headaches or if you develop a fever at home. ? ?Return to urgent care for new or worsening symptoms.  Go to the emergency room if your symptoms are severe.  If your symptoms are related to your pregnancy, please go to the maternal assessment unit that is located at [facility].  This is the emergency department for all things related to pregnancy.  ? ?Please follow-up with your OB/GYN for further evaluation and management of your pregnancy and for a apprehensive list of over-the-counter medications that you can take in pregnancy. ?

## 2021-08-28 NOTE — ED Provider Notes (Signed)
[facility]    CSN: [id number] Arrival date & time: 08/28/21  1237      History   Chief Complaint Chief Complaint  Patient presents with   Cough   Headache    HPI Madeline Garcia is a 26 y.o. female.   Patient presents to urgent care for evaluation of cough, headache, sore throat and nasal congestion for approximately 1 week. No fever. Patient has been using "vapoinhaler" over the counter nasal inhaler to be able to breathe through her nose. She is 5 months pregnant and states she doesn't know what other medicine she can take. States she has had a normal pregnancy so far and she receives consistent OB/GYN care.  Reports normal fetal movement.  denies urinary symptoms, nausea, vomiting, abdominal pain, eye drainage, chest pain, and shortness of breath. Denies headache at this time. Denies any aggravating or relieving factors. Denies history of seasonal allergies.      Cough Associated symptoms: headaches   Headache Associated symptoms: cough    Past Medical History:  Diagnosis Date   Medical history non-contributory     Patient Active Problem List   Diagnosis Date Noted   BMI 40.0-44.9, adult (HCC) 06/13/2021   History of vacuum extraction assisted delivery 06/13/2021   Supervision of other normal pregnancy, antepartum 06/01/2021   Tinea versicolor 02/04/2019    Past Surgical History:  Procedure Laterality Date   NO PAST SURGERIES      OB History     Gravida  2   Para  1   Term  1   Preterm      AB      Living  1      SAB      IAB      Ectopic      Multiple  0   Live Births  1            Home Medications    Prior to Admission medications   Medication Sig Start Date End Date Taking? Authorizing Provider  Guaifenesin 1200 MG TB12 Take 1 tablet (1,200 mg total) by mouth in the morning and at bedtime. 08/28/21  Yes Carlisle Beers, FNP  metroNIDAZOLE (FLAGYL) 500 MG tablet Take 1 tablet (500 mg total) by mouth 2  (two) times daily. 07/05/21   Calvert Cantor, CNM    Family History Family History  Problem Relation Age of Onset   Cancer Mother        Breast    Social History Social History   Tobacco Use   Smoking status: Never   Smokeless tobacco: Never  Vaping Use   Vaping Use: Never used  Substance Use Topics   Alcohol use: Never   Drug use: Never     Allergies   Patient has no known allergies.   Review of Systems Review of Systems  Respiratory:  Positive for cough.   Neurological:  Positive for headaches.  Per HPI  Physical Exam Triage Vital Signs ED Triage Vitals  Enc Vitals Group     BP 08/28/21 1335 105/67     Pulse Rate 08/28/21 1335 (!) 107     Resp 08/28/21 1335 18     Temp 08/28/21 1335 99 F (37.2 C)     Temp Source 08/28/21 1335 Oral     SpO2 08/28/21 1335 94 %     Weight --      Height --      Head Circumference --  Peak Flow --      Pain Score 08/28/21 1333 4     Pain Loc --      Pain Edu? --      Excl. in GC? --    No data found.  Updated Vital Signs BP 105/67 (BP Location: Left Arm)   Pulse (!) 107   Temp 99 F (37.2 C) (Oral)   Resp 18   LMP 12/26/2020   SpO2 94%   Visual Acuity Right Eye Distance:   Left Eye Distance:   Bilateral Distance:    Right Eye Near:   Left Eye Near:    Bilateral Near:     Physical Exam Vitals and nursing note reviewed.  Constitutional:      General: She is not in acute distress.    Appearance: Normal appearance. She is well-developed. She is not ill-appearing.  HENT:     Head: Normocephalic and atraumatic.     Right Ear: Tympanic membrane, ear canal and external ear normal.     Left Ear: Tympanic membrane, ear canal and external ear normal.     Nose: Congestion present. No rhinorrhea.     Mouth/Throat:     Mouth: Mucous membranes are moist.     Pharynx: No oropharyngeal exudate or posterior oropharyngeal erythema.     Comments: Small amount of postnasal drainage to posterior  oropharynx. Eyes:     General: Lids are normal. Lids are everted, no foreign bodies appreciated. Vision grossly intact.        Right eye: No discharge.        Left eye: No discharge.     Extraocular Movements: Extraocular movements intact.     Conjunctiva/sclera: Conjunctivae normal.  Cardiovascular:     Rate and Rhythm: Regular rhythm. Tachycardia present.     Heart sounds: Normal heart sounds, S1 normal and S2 normal. No murmur heard.   No friction rub. No gallop.     Comments: Likely related to pregnancy. Pulmonary:     Effort: Pulmonary effort is normal. No respiratory distress.     Breath sounds: Normal breath sounds and air entry. No decreased air movement. No decreased breath sounds, wheezing, rhonchi or rales.  Chest:     Chest wall: No tenderness.  Abdominal:     Palpations: Abdomen is soft.     Tenderness: There is no abdominal tenderness. There is no right CVA tenderness or left CVA tenderness.     Comments: Appears appropriate for stage in pregnancy.   Musculoskeletal:        General: No swelling.     Cervical back: Normal range of motion and neck supple.     Right lower leg: No edema.     Left lower leg: No edema.  Lymphadenopathy:     Cervical: No cervical adenopathy.  Skin:    General: Skin is warm and dry.     Capillary Refill: Capillary refill takes less than 2 seconds.     Findings: No rash.  Neurological:     General: No focal deficit present.     Mental Status: She is alert and oriented to person, place, and time. Mental status is at baseline.  Psychiatric:        Mood and Affect: Mood normal.        Behavior: Behavior normal.        Thought Content: Thought content normal.        Judgment: Judgment normal.     UC Treatments / Results  Labs (all  labs ordered are listed, but only abnormal results are displayed) Labs Reviewed - No data to display  EKG   Radiology No results found.  Procedures Procedures (including critical care  time)  Medications Ordered in UC Medications - No data to display  Initial Impression / Assessment and Plan / UC Course  I have reviewed the triage vital signs and the nursing notes.  Pertinent labs & imaging results that were available during my care of the patient were reviewed by me and considered in my medical decision making (see chart for details).  Patient's symptoms are consistent with a viral upper respiratory infection.  No clinical indication for COVID-19 or influenza testing at this time.  Patient given supportive care prescriptions.  Guaifenesin prescribed to be taken twice daily with food.  Patient instructed to increase her water intake so that this medication will work best in her body.  This medication is safe in pregnancy.  Patient also instructed to take Tylenol at home if she develops a headache.  She denies a headache at time of exam.  Review to follow-up with her OB/GYN for further pregnancy care and for comprehensive list of medications that are safe to take in pregnancy that are over-the-counter.  Patient also advised to go to the maternity assessment unit for any pregnancy related concerns like vaginal bleeding, contractions, or abdominal pain.  Patient instructed to return to urgent care for new or worsening symptoms.  Patient agrees with this plan and verbalizes understanding.  All questions answered.     Final Clinical Impressions(s) / UC Diagnoses   Final diagnoses:  Viral URI with cough     Discharge Instructions      You were seen in urgent care today for a viral upper respiratory infection.  Please take guaifenesin 1200 mg twice daily as prescribed.  As discussed, you need to drink water with this medication in order for it to work in your body to thin out the mucus so that you can cough it up and blow it out of in your nose easier.  You may also use saline nasal spray to loosen your mucus.  You may take Tylenol at home for your headaches or if you develop a  fever at home.  Return to urgent care for new or worsening symptoms.  Go to the emergency room if your symptoms are severe.  If your symptoms are related to your pregnancy, please go to the maternal assessment unit that is located at [facility].  This is the emergency department for all things related to pregnancy.   Please follow-up with your OB/GYN for further evaluation and management of your pregnancy and for a apprehensive list of over-the-counter medications that you can take in pregnancy.     ED Prescriptions     Medication Sig Dispense Auth. Provider   Guaifenesin 1200 MG TB12 Take 1 tablet (1,200 mg total) by mouth in the morning and at bedtime. 14 tablet Carlisle Beers, FNP      PDMP not reviewed this encounter.   Carlisle Beers, [place] 08/28/21 1452

## 2021-10-16 ENCOUNTER — Other Ambulatory Visit: Payer: Self-pay | Admitting: *Deleted

## 2021-10-16 ENCOUNTER — Ambulatory Visit (INDEPENDENT_AMBULATORY_CARE_PROVIDER_SITE_OTHER): Payer: Medicaid Other

## 2021-10-16 VITALS — BP 118/71 | HR 111 | Wt 265.0 lb

## 2021-10-16 DIAGNOSIS — Z3480 Encounter for supervision of other normal pregnancy, unspecified trimester: Secondary | ICD-10-CM | POA: Diagnosis not present

## 2021-10-16 DIAGNOSIS — Z23 Encounter for immunization: Secondary | ICD-10-CM | POA: Diagnosis not present

## 2021-10-16 DIAGNOSIS — O99213 Obesity complicating pregnancy, third trimester: Secondary | ICD-10-CM

## 2021-10-16 DIAGNOSIS — Z3A29 29 weeks gestation of pregnancy: Secondary | ICD-10-CM

## 2021-10-16 DIAGNOSIS — O2343 Unspecified infection of urinary tract in pregnancy, third trimester: Secondary | ICD-10-CM

## 2021-10-16 NOTE — Progress Notes (Signed)
Tdap vaccine explained and administered into right deltoid without any complications. Patient tolerated vaccine well and had no further questions or concerns.   Dawayne Patricia, CMA   10/16/21

## 2021-10-16 NOTE — Progress Notes (Signed)
   PRENATAL VISIT NOTE  Subjective:  Madeline Garcia is a 26 y.o. [name] at [redacted]w[redacted]d being seen today for ongoing prenatal care.  She is currently monitored for the following issues for this low-risk pregnancy and has Tinea versicolor; Supervision of other normal pregnancy, antepartum; BMI 40.0-44.9, adult (HCC); and History of vacuum extraction assisted delivery on their problem list.  Patient reports no complaints.  Contractions: Not present.  .  Movement: Present. Denies leaking of fluid.   The following portions of the patient's history were reviewed and updated as appropriate: allergies, current medications, past family history, past medical history, past social history, past surgical history and problem list.   Objective:   Vitals:   10/16/21 1035  BP: 118/71  Pulse: (!) 111  Weight: 265 lb (120.2 kg)    Fetal Status: Fetal Heart Rate (bpm): 144 Fundal Height: 30 cm Movement: Present     General:  Alert, oriented and cooperative. Patient is in no acute distress.  Skin: Skin is warm and dry. No rash noted.   Cardiovascular: Normal heart rate noted  Respiratory: Normal respiratory effort, no problems with respiration noted  Abdomen: Soft, gravid, appropriate for gestational age.  Pain/Pressure: Absent     Pelvic: Cervical exam deferred        Extremities: Normal range of motion.     Mental Status: Normal mood and affect. Normal behavior. Normal judgment and thought content.   Assessment and Plan:  Pregnancy: [name] at [redacted]w[redacted]d 1. Supervision of other normal pregnancy, antepartum - Routine OB. Doing well, no concerns - No prenatal care from 15-29 weeks - Will have patient schedule lab only visit for GTT and labs - Tdap today - Anatomy ultrasound scheduled on 6/21  - Tdap vaccine greater than or equal to 28yo IM  2. [redacted] weeks gestation of pregnancy - Endorses active fetal movement - FH appropriate  - Tdap vaccine greater than or equal to 28yo IM  3. Urinary tract  infection in mother during third trimester of pregnancy - Completed antibiotic course in March, no symptoms - Repeat culture today  - Culture, OB Urine  4. Obesity affecting pregnancy in third trimester   Preterm labor symptoms and general obstetric precautions including but not limited to vaginal bleeding, contractions, leaking of fluid and fetal movement were reviewed in detail with the patient. Please refer to After Visit Summary for other counseling recommendations.   Return in about 2 weeks (around 10/30/2021) for LOB.  Future Appointments  Date Time Provider Department Center  10/25/2021  8:50 AM [facility]  10/25/2021 11:15 AM [facility] NURSE [facility]  10/25/2021 11:30 AM [facility] US2 [facility]  11/08/2021  1:35 PM Bernerd Limbo, CNM [facility]    Brand Males, CNM

## 2021-10-25 ENCOUNTER — Ambulatory Visit: Payer: Medicaid Other

## 2021-10-25 ENCOUNTER — Other Ambulatory Visit: Payer: Medicaid Other

## 2021-10-25 ENCOUNTER — Other Ambulatory Visit: Payer: Self-pay

## 2021-10-25 DIAGNOSIS — Z3480 Encounter for supervision of other normal pregnancy, unspecified trimester: Secondary | ICD-10-CM

## 2021-10-27 ENCOUNTER — Other Ambulatory Visit: Payer: Medicaid Other

## 2021-11-08 ENCOUNTER — Ambulatory Visit (INDEPENDENT_AMBULATORY_CARE_PROVIDER_SITE_OTHER): Payer: Medicaid Other | Admitting: Certified Nurse Midwife

## 2021-11-08 ENCOUNTER — Other Ambulatory Visit: Payer: Self-pay

## 2021-11-08 VITALS — BP 113/72 | HR 115 | Wt 265.6 lb

## 2021-11-08 DIAGNOSIS — Z3A32 32 weeks gestation of pregnancy: Secondary | ICD-10-CM

## 2021-11-08 DIAGNOSIS — Z3493 Encounter for supervision of normal pregnancy, unspecified, third trimester: Secondary | ICD-10-CM

## 2021-11-08 NOTE — Progress Notes (Signed)
   PRENATAL VISIT NOTE  Subjective:  Madeline Garcia is a 26 y.o. [name] at [redacted]w[redacted]d being seen today for ongoing prenatal care.  She is currently monitored for the following issues for this low-risk pregnancy and has Tinea versicolor; Supervision of other normal pregnancy, antepartum; BMI 40.0-44.9, adult (HCC); and History of vacuum extraction assisted delivery on their problem list.  Patient reports no complaints.  Contractions: Not present. Vag. Bleeding: None.  Movement: Present. Denies leaking of fluid.   The following portions of the patient's history were reviewed and updated as appropriate: allergies, current medications, past family history, past medical history, past social history, past surgical history and problem list.   Objective:   Vitals:   11/08/21 1349  BP: 113/72  Pulse: (!) 115  Weight: 265 lb 9.6 oz (120.5 kg)    Fetal Status: Fetal Heart Rate (bpm): 146 Fundal Height: 33 cm Movement: Present  Presentation: Vertex  General:  Alert, oriented and cooperative. Patient is in no acute distress.  Skin: Skin is warm and dry. No rash noted.   Cardiovascular: Normal heart rate noted  Respiratory: Normal respiratory effort, no problems with respiration noted  Abdomen: Soft, gravid, appropriate for gestational age.  Pain/Pressure: Absent     Pelvic: Cervical exam deferred        Extremities: Normal range of motion.  Edema: None  Mental Status: Normal mood and affect. Normal behavior. Normal judgment and thought content.   Assessment and Plan:  Pregnancy: [name] at [redacted]w[redacted]d 1. Encounter for supervision of low-risk pregnancy in third trimester - Doing well, feeling regular and vigorous fetal movement  - Desires virtual visit next  2. [redacted] weeks gestation of pregnancy - Routine OB care including anticipatory guidance re GBS testing at 36wk appt - Pt to come in for GTT asap as she missed that appt  Preterm labor symptoms and general obstetric precautions including but  not limited to vaginal bleeding, contractions, leaking of fluid and fetal movement were reviewed in detail with the patient. Please refer to After Visit Summary for other counseling recommendations.   Return for LOB, VIRTUAL.  Future Appointments  Date Time Provider Department Center  11/15/2021  8:20 AM [facility]  11/21/2021  7:30 AM [facility] NURSE [facility]  11/21/2021  7:45 AM [facility] US4 [facility]  11/22/2021  1:35 PM Corlis Hove, NP [facility]  12/06/2021  1:35 PM Bernerd Limbo, CNM [facility]  12/13/2021  3:55 PM Bernerd Limbo, CNM [facility]  12/20/2021  1:35 PM Bernerd Limbo, CNM [facility]  12/27/2021  1:15 PM Bernerd Limbo, CNM [facility]  01/03/2022  1:15 PM Federico Flake, MD [facility]  01/03/2022  2:15 PM [facility] NST [facility]    Bernerd Limbo, CNM

## 2021-11-15 ENCOUNTER — Other Ambulatory Visit: Payer: Self-pay

## 2021-11-15 ENCOUNTER — Other Ambulatory Visit: Payer: Medicaid Other

## 2021-11-15 DIAGNOSIS — Z3480 Encounter for supervision of other normal pregnancy, unspecified trimester: Secondary | ICD-10-CM

## 2021-11-16 ENCOUNTER — Telehealth: Payer: Self-pay

## 2021-11-16 ENCOUNTER — Other Ambulatory Visit: Payer: Self-pay

## 2021-11-16 DIAGNOSIS — O99013 Anemia complicating pregnancy, third trimester: Secondary | ICD-10-CM

## 2021-11-16 LAB — CBC
Hematocrit: 32.4 % — ABNORMAL LOW (ref 34.0–46.6)
Hemoglobin: 10.7 g/dL — ABNORMAL LOW (ref 11.1–15.9)
MCH: 26.8 pg (ref 26.6–33.0)
MCHC: 33 g/dL (ref 31.5–35.7)
MCV: 81 fL (ref 79–97)
Platelets: 324 10*3/uL (ref 150–450)
RBC: 4 x10E6/uL (ref 3.77–5.28)
RDW: 13.2 % (ref 11.7–15.4)
WBC: 8.9 10*3/uL (ref 3.4–10.8)

## 2021-11-16 LAB — RPR: RPR Ser Ql: NONREACTIVE

## 2021-11-16 LAB — GLUCOSE TOLERANCE, 2 HOURS W/ 1HR
Glucose, 1 hour: 198 mg/dL — ABNORMAL HIGH (ref 70–179)
Glucose, 2 hour: 98 mg/dL (ref 70–152)
Glucose, Fasting: 88 mg/dL (ref 70–91)

## 2021-11-16 LAB — HIV ANTIBODY (ROUTINE TESTING W REFLEX): HIV Screen 4th Generation wRfx: NONREACTIVE

## 2021-11-16 MED ORDER — FERROUS SULFATE 325 (65 FE) MG PO TABS: 325.0000 mg | ORAL_TABLET | ORAL | 1 refills | Status: DC

## 2021-11-16 NOTE — Progress Notes (Signed)
ferro

## 2021-11-16 NOTE — Telephone Encounter (Addendum)
-----   Message from Brand Males, CNM sent at 11/16/2021 12:58 PM EDT ----- Patient did not pass glucola. She has GDM. Can you please set her up with diabetes coordinator and send supplies.   Thanks! Danielle, CNM  Called pt at both numbers listed.  Unable to leave message due to message stating "subscriber is unavailable please try your call again later."  [facility] 11/16/21

## 2021-11-21 ENCOUNTER — Encounter: Payer: Self-pay | Admitting: *Deleted

## 2021-11-21 ENCOUNTER — Other Ambulatory Visit: Payer: Self-pay | Admitting: Advanced Practice Midwife

## 2021-11-21 ENCOUNTER — Ambulatory Visit (HOSPITAL_BASED_OUTPATIENT_CLINIC_OR_DEPARTMENT_OTHER): Payer: Medicaid Other

## 2021-11-21 ENCOUNTER — Ambulatory Visit: Payer: Medicaid Other | Attending: Advanced Practice Midwife | Admitting: *Deleted

## 2021-11-21 ENCOUNTER — Other Ambulatory Visit: Payer: Self-pay | Admitting: *Deleted

## 2021-11-21 VITALS — BP 121/71 | HR 101

## 2021-11-21 DIAGNOSIS — Z3480 Encounter for supervision of other normal pregnancy, unspecified trimester: Secondary | ICD-10-CM | POA: Diagnosis not present

## 2021-11-21 DIAGNOSIS — Z363 Encounter for antenatal screening for malformations: Secondary | ICD-10-CM | POA: Insufficient documentation

## 2021-11-21 DIAGNOSIS — Z3689 Encounter for other specified antenatal screening: Secondary | ICD-10-CM

## 2021-11-21 DIAGNOSIS — O24419 Gestational diabetes mellitus in pregnancy, unspecified control: Secondary | ICD-10-CM

## 2021-11-21 DIAGNOSIS — Z3687 Encounter for antenatal screening for uncertain dates: Secondary | ICD-10-CM | POA: Insufficient documentation

## 2021-11-21 DIAGNOSIS — O99213 Obesity complicating pregnancy, third trimester: Secondary | ICD-10-CM | POA: Insufficient documentation

## 2021-11-21 DIAGNOSIS — Z3A34 34 weeks gestation of pregnancy: Secondary | ICD-10-CM | POA: Diagnosis not present

## 2021-11-22 ENCOUNTER — Telehealth: Payer: Medicaid Other | Admitting: Student

## 2021-11-27 NOTE — Telephone Encounter (Signed)
Attempted to call pt several times unable to reach.  Note left in pt's appt notes about GDM and scheduling.    Leonette Nutting  11/27/21

## 2021-11-29 ENCOUNTER — Ambulatory Visit: Payer: Medicaid Other | Admitting: *Deleted

## 2021-11-29 ENCOUNTER — Ambulatory Visit (INDEPENDENT_AMBULATORY_CARE_PROVIDER_SITE_OTHER): Payer: Medicaid Other

## 2021-11-29 ENCOUNTER — Other Ambulatory Visit: Payer: Self-pay

## 2021-11-29 DIAGNOSIS — O24419 Gestational diabetes mellitus in pregnancy, unspecified control: Secondary | ICD-10-CM | POA: Insufficient documentation

## 2021-11-29 DIAGNOSIS — O99213 Obesity complicating pregnancy, third trimester: Secondary | ICD-10-CM

## 2021-11-29 DIAGNOSIS — Z3A35 35 weeks gestation of pregnancy: Secondary | ICD-10-CM | POA: Diagnosis not present

## 2021-11-29 MED ORDER — GLUCOSE BLOOD VI STRP
ORAL_STRIP | 12 refills | Status: DC
Start: 2021-11-29 — End: 2021-12-17

## 2021-11-29 MED ORDER — ACCU-CHEK GUIDE W/DEVICE KIT: 1.0000 | PACK | Freq: Four times a day (QID) | 0 refills | Status: DC

## 2021-11-29 MED ORDER — ACCU-CHEK SOFTCLIX LANCETS MISC: 12 refills | Status: DC

## 2021-11-29 NOTE — Progress Notes (Signed)
Pt informed that the ultrasound is considered a limited OB ultrasound and is not intended to be a complete ultrasound exam.  Patient also informed that the ultrasound is not being completed with the intent of assessing for fetal or placental anomalies or any pelvic abnormalities.  Explained that the purpose of today's ultrasound is to assess for presentation, BPP and amniotic fluid volume.  Patient acknowledges the purpose of the exam and the limitations of the study.    Pt was informed of 2hr GTT results and need for Diabetes Ed appt on 8/1 @ 0800 [facility]. Rx for glucose meter and supplies will be sent to her pharmacy and she will need to bring these to the appt. Pt voiced understanding of information and instructions given.

## 2021-12-05 ENCOUNTER — Ambulatory Visit: Payer: Medicaid Other | Admitting: Nutrition

## 2021-12-06 ENCOUNTER — Other Ambulatory Visit: Payer: Medicaid Other

## 2021-12-06 ENCOUNTER — Ambulatory Visit (INDEPENDENT_AMBULATORY_CARE_PROVIDER_SITE_OTHER): Payer: Medicaid Other | Admitting: Certified Nurse Midwife

## 2021-12-06 DIAGNOSIS — Z3A36 36 weeks gestation of pregnancy: Secondary | ICD-10-CM

## 2021-12-06 DIAGNOSIS — O0993 Supervision of high risk pregnancy, unspecified, third trimester: Secondary | ICD-10-CM

## 2021-12-06 DIAGNOSIS — O24410 Gestational diabetes mellitus in pregnancy, diet controlled: Secondary | ICD-10-CM

## 2021-12-06 NOTE — Addendum Note (Signed)
Addended by: Edd Arbour on: 12/06/2021 08:49 PM   Modules accepted: Level of Service

## 2021-12-06 NOTE — Progress Notes (Signed)
No show

## 2021-12-13 ENCOUNTER — Encounter: Payer: Self-pay | Admitting: Obstetrics and Gynecology

## 2021-12-13 ENCOUNTER — Other Ambulatory Visit (HOSPITAL_COMMUNITY)
Admission: RE | Admit: 2021-12-13 | Discharge: 2021-12-13 | Disposition: A | Discharge status: Home / Self Care | Payer: Medicaid Other | Source: Ambulatory Visit | Attending: Certified Nurse Midwife | Admitting: Certified Nurse Midwife

## 2021-12-13 ENCOUNTER — Ambulatory Visit (INDEPENDENT_AMBULATORY_CARE_PROVIDER_SITE_OTHER): Payer: Medicaid Other | Admitting: Obstetrics and Gynecology

## 2021-12-13 ENCOUNTER — Ambulatory Visit (INDEPENDENT_AMBULATORY_CARE_PROVIDER_SITE_OTHER): Payer: Medicaid Other

## 2021-12-13 ENCOUNTER — Other Ambulatory Visit: Payer: Self-pay

## 2021-12-13 ENCOUNTER — Ambulatory Visit: Payer: Medicaid Other | Admitting: *Deleted

## 2021-12-13 VITALS — BP 111/73 | HR 101 | Wt 261.4 lb

## 2021-12-13 DIAGNOSIS — O99213 Obesity complicating pregnancy, third trimester: Secondary | ICD-10-CM

## 2021-12-13 DIAGNOSIS — O24410 Gestational diabetes mellitus in pregnancy, diet controlled: Secondary | ICD-10-CM

## 2021-12-13 DIAGNOSIS — O2343 Unspecified infection of urinary tract in pregnancy, third trimester: Secondary | ICD-10-CM

## 2021-12-13 DIAGNOSIS — O24419 Gestational diabetes mellitus in pregnancy, unspecified control: Secondary | ICD-10-CM

## 2021-12-13 DIAGNOSIS — O0993 Supervision of high risk pregnancy, unspecified, third trimester: Secondary | ICD-10-CM | POA: Insufficient documentation

## 2021-12-13 DIAGNOSIS — O3660X0 Maternal care for excessive fetal growth, unspecified trimester, not applicable or unspecified: Secondary | ICD-10-CM

## 2021-12-13 DIAGNOSIS — Z8759 Personal history of other complications of pregnancy, childbirth and the puerperium: Secondary | ICD-10-CM

## 2021-12-13 DIAGNOSIS — O09893 Supervision of other high risk pregnancies, third trimester: Secondary | ICD-10-CM

## 2021-12-13 DIAGNOSIS — Z91199 Patient's noncompliance with other medical treatment and regimen due to unspecified reason: Secondary | ICD-10-CM | POA: Insufficient documentation

## 2021-12-13 DIAGNOSIS — O0930 Supervision of pregnancy with insufficient antenatal care, unspecified trimester: Secondary | ICD-10-CM | POA: Insufficient documentation

## 2021-12-13 DIAGNOSIS — O0933 Supervision of pregnancy with insufficient antenatal care, third trimester: Secondary | ICD-10-CM

## 2021-12-13 DIAGNOSIS — Z8619 Personal history of other infectious and parasitic diseases: Secondary | ICD-10-CM

## 2021-12-13 DIAGNOSIS — Z6841 Body Mass Index (BMI) 40.0 and over, adult: Secondary | ICD-10-CM

## 2021-12-13 LAB — GLUCOSE, CAPILLARY: Glucose-Capillary: 71 mg/dL (ref 70–99)

## 2021-12-13 NOTE — Progress Notes (Signed)
PRENATAL VISIT NOTE  Subjective:  Madeline Garcia is a 26 y.o. [name] at [redacted]w[redacted]d being seen today for ongoing prenatal care.  She is currently monitored for the following issues for this high-risk pregnancy and has Tinea versicolor; Supervision of other normal pregnancy, antepartum; BMI 40.0-44.9, adult (HCC); History of vacuum extraction assisted delivery; Gestational diabetes mellitus (GDM), antepartum; LGA (large for gestational age) fetus affecting management of mother; Insufficient prenatal care; and Noncompliant pregnant patient in third trimester on their problem list.  Patient reports occasional contractions.  Contractions: Irregular. Vag. Bleeding: None.  Movement: Present. Denies leaking of fluid.   The following portions of the patient's history were reviewed and updated as appropriate: allergies, current medications, past family history, past medical history, past social history, past surgical history and problem list.   Objective:   Vitals:   12/13/21 1512  BP: 111/73  Pulse: (!) 101  Weight: 261 lb 6.4 oz (118.6 kg)    Fetal Status: Fetal Heart Rate (bpm): RNST   Movement: Present  Presentation: Vertex  General:  Alert, oriented and cooperative. Patient is in no acute distress.  Skin: Skin is warm and dry. No rash noted.   Cardiovascular: Normal heart rate noted  Respiratory: Normal respiratory effort, no problems with respiration noted  Abdomen: Soft, gravid, appropriate for gestational age.  Pain/Pressure: Present     Pelvic: Cervical exam performed in the presence of a chaperone Dilation: 1 Effacement (%): 50 Station: -3 By Tyler Aas CNM (patient preferred a female provider and desired a cx check)  Extremities: Normal range of motion.  Edema: None  Mental Status: Normal mood and affect. Normal behavior. Normal judgment and thought content.   Assessment and Plan:  Pregnancy: [name] at [redacted]w[redacted]d 1. Supervision of high risk pregnancy in third trimester -  GC/Chlamydia probe amp ([place])not at [facility] - Strep Gp B NAA - Hemoglobin A1c - CBC; Standing - Type and screen; Standing - RPR; Standing - Culture, OB Urine; Standing - CBG monitoring (now and Q4H); Standing - CBG monitoring (now and Q2H); Standing  2.  Gestational diabetes mellitus (GDM) in third trimester Patient did not go to DM education class and not checking CBGs b/c she doesn't feel comfortable with it. Random checked and it is 26; she had [employer] approximately 3 hours before. Importance of checking sugars and risks of maternal and fetal distress d/w her. Given this and LGA, recommend IOL which she is amenable to. Pt set up for 8/10 @ 2345  3. Obesity affecting pregnancy in third trimester  4. History of gestational hypertension  5. History of vacuum extraction assisted delivery 2020, 41wks: 3610gm, non GDM during that pregnancy. due to non-reassuring fetal heart tracing with prolonged decelerations.    6. BMI 40.0-44.9, adult (HCC)  7. Excessive fetal growth affecting management of pregnancy, antepartum, single or unspecified fetus Extrapolates to about 4000gm. She has a large AC. SD precautions  7/18: 98%, 3142g, ac>99%, afi 21 8. Gestational diabetes mellitus (GDM), antepartum, gestational diabetes method of control unspecified - Hemoglobin A1c - CBC; Standing - Type and screen; Standing - RPR; Standing - Culture, OB Urine; Standing - CBG monitoring (now and Q4H); Standing - CBG monitoring (now and Q2H); Standing  9. Noncompliant pregnant patient in third trimester  10. Insufficient prenatal care in third trimester  11. Urinary tract infection in mother during third trimester of pregnancy Pt to try and leave sample for test of cure. Can get on L&D  12. History of group B Streptococcus (  GBS) infection Will tx as positive until swab done today is back  Preterm labor symptoms and general obstetric precautions including but not limited to vaginal bleeding,  contractions, leaking of fluid and fetal movement were reviewed in detail with the patient. Please refer to After Visit Summary for other counseling recommendations.   Return today (on 12/13/2021), or if symptoms worsen or fail to improve, for Ob f/u as scheduled.  Future Appointments  Date Time Provider Department Center  12/15/2021 12:00 AM [facility] SCHED ROOM [facility] None  12/19/2021  8:45 AM [facility] NURSE [facility]  12/19/2021  9:00 AM [facility] US1 [facility]  12/20/2021  1:35 PM Bernerd Limbo, CNM [facility]  12/27/2021  1:15 PM [facility]  12/27/2021  2:15 PM [facility] NST [facility]  01/03/2022  1:15 PM Federico Flake, MD [facility]  01/03/2022  2:15 PM [facility] NST [facility]    Heyworth Bing, MD

## 2021-12-13 NOTE — Progress Notes (Signed)
Pt did not keep Diabetes class appt on 8/1. She states she is not comfortable with checking her blood sugar and so she is not doing it.  [place] for growth & BPP @ [facility] on 8/15.

## 2021-12-14 ENCOUNTER — Other Ambulatory Visit: Payer: Self-pay

## 2021-12-14 LAB — HEMOGLOBIN A1C
Est. average glucose Bld gHb Est-mCnc: 120 mg/dL
Hgb A1c MFr Bld: 5.8 % — ABNORMAL HIGH (ref 4.8–5.6)

## 2021-12-14 LAB — GC/CHLAMYDIA PROBE AMP (~~LOC~~) NOT AT ARMC
Chlamydia: NEGATIVE
Comment: NEGATIVE
Comment: NORMAL
Neisseria Gonorrhea: NEGATIVE

## 2021-12-15 ENCOUNTER — Inpatient Hospital Stay (HOSPITAL_COMMUNITY): Payer: Medicaid Other

## 2021-12-15 ENCOUNTER — Inpatient Hospital Stay (HOSPITAL_COMMUNITY): Payer: Medicaid Other | Admitting: Anesthesiology

## 2021-12-15 ENCOUNTER — Other Ambulatory Visit: Payer: Self-pay

## 2021-12-15 ENCOUNTER — Inpatient Hospital Stay (HOSPITAL_COMMUNITY)
Admission: AD | Admit: 2021-12-15 | Discharge: 2021-12-17 | DRG: 807 | Disposition: A | Discharge status: Home / Self Care | Payer: Medicaid Other | Attending: Obstetrics & Gynecology | Admitting: Obstetrics & Gynecology

## 2021-12-15 ENCOUNTER — Encounter (HOSPITAL_COMMUNITY): Payer: Self-pay | Admitting: Obstetrics and Gynecology

## 2021-12-15 DIAGNOSIS — O134 Gestational [pregnancy-induced] hypertension without significant proteinuria, complicating childbirth: Secondary | ICD-10-CM | POA: Diagnosis present

## 2021-12-15 DIAGNOSIS — O3663X1 Maternal care for excessive fetal growth, third trimester, fetus 1: Secondary | ICD-10-CM | POA: Diagnosis not present

## 2021-12-15 DIAGNOSIS — O0993 Supervision of high risk pregnancy, unspecified, third trimester: Secondary | ICD-10-CM

## 2021-12-15 DIAGNOSIS — O3663X0 Maternal care for excessive fetal growth, third trimester, not applicable or unspecified: Secondary | ICD-10-CM | POA: Diagnosis present

## 2021-12-15 DIAGNOSIS — O9902 Anemia complicating childbirth: Secondary | ICD-10-CM | POA: Diagnosis present

## 2021-12-15 DIAGNOSIS — D509 Iron deficiency anemia, unspecified: Secondary | ICD-10-CM | POA: Diagnosis present

## 2021-12-15 DIAGNOSIS — Z3A38 38 weeks gestation of pregnancy: Secondary | ICD-10-CM

## 2021-12-15 DIAGNOSIS — O99214 Obesity complicating childbirth: Secondary | ICD-10-CM | POA: Diagnosis present

## 2021-12-15 DIAGNOSIS — Z3480 Encounter for supervision of other normal pregnancy, unspecified trimester: Secondary | ICD-10-CM

## 2021-12-15 DIAGNOSIS — O24420 Gestational diabetes mellitus in childbirth, diet controlled: Secondary | ICD-10-CM | POA: Diagnosis present

## 2021-12-15 DIAGNOSIS — Z91199 Patient's noncompliance with other medical treatment and regimen due to unspecified reason: Secondary | ICD-10-CM

## 2021-12-15 DIAGNOSIS — O24419 Gestational diabetes mellitus in pregnancy, unspecified control: Secondary | ICD-10-CM

## 2021-12-15 DIAGNOSIS — Z8632 Personal history of gestational diabetes: Secondary | ICD-10-CM | POA: Diagnosis present

## 2021-12-15 LAB — GLUCOSE, CAPILLARY
Glucose-Capillary: 102 mg/dL — ABNORMAL HIGH (ref 70–99)
Glucose-Capillary: 135 mg/dL — ABNORMAL HIGH (ref 70–99)
Glucose-Capillary: 123 mg/dL — ABNORMAL HIGH (ref 70–99)
Glucose-Capillary: 91 mg/dL (ref 70–99)
Glucose-Capillary: 86 mg/dL (ref 70–99)
Glucose-Capillary: 94 mg/dL (ref 70–99)
Glucose-Capillary: 86 mg/dL (ref 70–99)
Glucose-Capillary: 101 mg/dL — ABNORMAL HIGH (ref 70–99)

## 2021-12-15 LAB — COMPREHENSIVE METABOLIC PANEL
ALT: 12 U/L (ref 0–44)
AST: 20 U/L (ref 15–41)
Albumin: 2.9 g/dL — ABNORMAL LOW (ref 3.5–5.0)
Alkaline Phosphatase: 183 U/L — ABNORMAL HIGH (ref 38–126)
Anion gap: 10 (ref 5–15)
BUN: 8 mg/dL (ref 6–20)
CO2: 19 mmol/L — ABNORMAL LOW (ref 22–32)
Calcium: 8.9 mg/dL (ref 8.9–10.3)
Chloride: 108 mmol/L (ref 98–111)
Creatinine, Ser: 0.54 mg/dL (ref 0.44–1.00)
GFR, Estimated: >60 mL/min (ref >60)
Glucose, Bld: 86 mg/dL (ref 70–99)
Potassium: 3 mmol/L — ABNORMAL LOW (ref 3.5–5.1)
Sodium: 137 mmol/L (ref 135–145)
Total Bilirubin: 0.5 mg/dL (ref 0.3–1.2)
Total Protein: 6.8 g/dL (ref 6.5–8.1)

## 2021-12-15 LAB — CBC
HCT: 29.7 % — ABNORMAL LOW (ref 36.0–46.0)
Hemoglobin: 9.9 g/dL — ABNORMAL LOW (ref 12.0–15.0)
MCH: 26.2 pg (ref 26.0–34.0)
MCHC: 33.3 g/dL (ref 30.0–36.0)
MCV: 78.6 fL — ABNORMAL LOW (ref 80.0–100.0)
Platelets: 366 10*3/uL (ref 150–400)
RBC: 3.78 MIL/uL — ABNORMAL LOW (ref 3.87–5.11)
RDW: 13.6 % (ref 11.5–15.5)
WBC: 10.7 10*3/uL — ABNORMAL HIGH (ref 4.0–10.5)
nRBC: 0 % (ref 0.0–0.2)

## 2021-12-15 LAB — TYPE AND SCREEN
ABO/RH(D): B POS
Antibody Screen: NEGATIVE

## 2021-12-15 LAB — PROTEIN / CREATININE RATIO, URINE
Creatinine, Urine: 52 mg/dL
Protein Creatinine Ratio: 0.21 mg/mg{Cre} — ABNORMAL HIGH (ref 0.00–0.15)
Total Protein, Urine: 11 mg/dL

## 2021-12-15 LAB — RPR: RPR Ser Ql: NONREACTIVE

## 2021-12-15 LAB — STREP GP B NAA: Strep Gp B NAA: NEGATIVE

## 2021-12-15 MED ORDER — WITCH HAZEL-GLYCERIN EX PADS: 1.0000 | MEDICATED_PAD | CUTANEOUS | Status: DC | PRN

## 2021-12-15 MED ORDER — LIDOCAINE HCL (PF) 1 % IJ SOLN
INTRAMUSCULAR | Status: DC | PRN
  Administered 2021-12-15: 4 mL via EPIDURAL
  Administered 2021-12-15: 6 mL via EPIDURAL

## 2021-12-15 MED ORDER — DIPHENHYDRAMINE HCL 50 MG/ML IJ SOLN: 12.5000 mg | INTRAMUSCULAR | Status: DC | PRN

## 2021-12-15 MED ORDER — MISOPROSTOL 200 MCG PO TABS
ORAL_TABLET | ORAL | Status: AC
  Filled 2021-12-15: qty 4

## 2021-12-15 MED ORDER — ACETAMINOPHEN 325 MG PO TABS: 650.0000 mg | ORAL_TABLET | ORAL | Status: DC | PRN

## 2021-12-15 MED ORDER — SODIUM CHLORIDE 0.9 % IV SOLN
5.0000 10*6.[IU] | Freq: Once | INTRAVENOUS | Status: AC
  Administered 2021-12-15: 5 10*6.[IU] via INTRAVENOUS
  Filled 2021-12-15: qty 5

## 2021-12-15 MED ORDER — ONDANSETRON HCL 4 MG/2ML IJ SOLN: 4.0000 mg | Freq: Four times a day (QID) | INTRAMUSCULAR | Status: DC | PRN

## 2021-12-15 MED ORDER — OXYTOCIN-SODIUM CHLORIDE 30-0.9 UT/500ML-% IV SOLN
2.5000 [IU]/h | INTRAVENOUS | Status: DC
  Administered 2021-12-15: 2.5 [IU]/h via INTRAVENOUS
  Filled 2021-12-15: qty 500

## 2021-12-15 MED ORDER — OXYTOCIN BOLUS FROM INFUSION
333.0000 mL | Freq: Once | INTRAVENOUS | Status: AC
  Administered 2021-12-15: 333 mL via INTRAVENOUS

## 2021-12-15 MED ORDER — FENTANYL-BUPIVACAINE-NACL 0.5-0.125-0.9 MG/250ML-% EP SOLN
12.0000 mL/h | EPIDURAL | Status: DC | PRN
  Administered 2021-12-15 (×2): 12 mL/h via EPIDURAL
  Filled 2021-12-15: qty 250

## 2021-12-15 MED ORDER — PENICILLIN G POT IN DEXTROSE 60000 UNIT/ML IV SOLN
3.0000 10*6.[IU] | INTRAVENOUS | Status: DC
  Administered 2021-12-15 (×2): 3 10*6.[IU] via INTRAVENOUS
  Filled 2021-12-15 (×2): qty 50

## 2021-12-15 MED ORDER — LIDOCAINE HCL (PF) 1 % IJ SOLN
30.0000 mL | INTRAMUSCULAR | Status: DC | PRN
Start: 2021-12-15 — End: 2021-12-15

## 2021-12-15 MED ORDER — TETANUS-DIPHTH-ACELL PERTUSSIS 5-2.5-18.5 LF-MCG/0.5 IM SUSY: 0.5000 mL | PREFILLED_SYRINGE | Freq: Once | INTRAMUSCULAR | Status: DC

## 2021-12-15 MED ORDER — TERBUTALINE SULFATE 1 MG/ML IJ SOLN: 0.2500 mg | Freq: Once | INTRAMUSCULAR | Status: DC | PRN

## 2021-12-15 MED ORDER — SOD CITRATE-CITRIC ACID 500-334 MG/5ML PO SOLN
30.0000 mL | ORAL | Status: DC | PRN
Start: 2021-12-15 — End: 2021-12-15

## 2021-12-15 MED ORDER — LACTATED RINGERS IV SOLN: INTRAVENOUS | Status: DC

## 2021-12-15 MED ORDER — MISOPROSTOL 25 MCG QUARTER TABLET
25.0000 ug | ORAL_TABLET | ORAL | Status: DC | PRN
  Administered 2021-12-15: 25 ug via VAGINAL
  Filled 2021-12-15: qty 1

## 2021-12-15 MED ORDER — LACTATED RINGERS IV SOLN: 500.0000 mL | INTRAVENOUS | Status: DC | PRN

## 2021-12-15 MED ORDER — ONDANSETRON HCL 4 MG PO TABS: 4.0000 mg | ORAL_TABLET | ORAL | Status: DC | PRN

## 2021-12-15 MED ORDER — EPHEDRINE 5 MG/ML INJ: 10.0000 mg | INTRAVENOUS | Status: DC | PRN

## 2021-12-15 MED ORDER — PHENYLEPHRINE 80 MCG/ML (10ML) SYRINGE FOR IV PUSH (FOR BLOOD PRESSURE SUPPORT): 80.0000 ug | PREFILLED_SYRINGE | INTRAVENOUS | Status: DC | PRN

## 2021-12-15 MED ORDER — PRENATAL MULTIVITAMIN CH
1.0000 | ORAL_TABLET | Freq: Every day | ORAL | Status: DC
  Administered 2021-12-16 – 2021-12-17 (×2): 1 via ORAL
  Filled 2021-12-15 (×2): qty 1

## 2021-12-15 MED ORDER — OXYTOCIN-SODIUM CHLORIDE 30-0.9 UT/500ML-% IV SOLN
1.0000 m[IU]/min | INTRAVENOUS | Status: DC
  Administered 2021-12-15: 2 m[IU]/min via INTRAVENOUS

## 2021-12-15 MED ORDER — MISOPROSTOL 200 MCG PO TABS
800.0000 ug | ORAL_TABLET | Freq: Once | ORAL | Status: AC
Start: 2021-12-15 — End: 2021-12-15
  Administered 2021-12-15: 800 ug via RECTAL

## 2021-12-15 MED ORDER — SIMETHICONE 80 MG PO CHEW: 80.0000 mg | CHEWABLE_TABLET | ORAL | Status: DC | PRN

## 2021-12-15 MED ORDER — SENNOSIDES-DOCUSATE SODIUM 8.6-50 MG PO TABS
2.0000 | ORAL_TABLET | Freq: Every day | ORAL | Status: DC
  Administered 2021-12-16 – 2021-12-17 (×2): 2 via ORAL
  Filled 2021-12-15 (×2): qty 2

## 2021-12-15 MED ORDER — TRANEXAMIC ACID-NACL 1000-0.7 MG/100ML-% IV SOLN
1000.0000 mg | Freq: Once | INTRAVENOUS | Status: AC
Start: 2021-12-15 — End: 2021-12-15
  Administered 2021-12-15: 1000 mg via INTRAVENOUS

## 2021-12-15 MED ORDER — EPHEDRINE 5 MG/ML INJ
10.0000 mg | INTRAVENOUS | Status: DC | PRN
Start: 2021-12-15 — End: 2021-12-15

## 2021-12-15 MED ORDER — DIPHENHYDRAMINE HCL 25 MG PO CAPS: 25.0000 mg | ORAL_CAPSULE | Freq: Four times a day (QID) | ORAL | Status: DC | PRN

## 2021-12-15 MED ORDER — BENZOCAINE-MENTHOL 20-0.5 % EX AERO
1.0000 | INHALATION_SPRAY | CUTANEOUS | Status: DC | PRN
  Administered 2021-12-15: 1 via TOPICAL
  Filled 2021-12-15: qty 56

## 2021-12-15 MED ORDER — DIBUCAINE (PERIANAL) 1 % EX OINT
1.0000 | TOPICAL_OINTMENT | CUTANEOUS | Status: DC | PRN
Start: 2021-12-15 — End: 2021-12-17

## 2021-12-15 MED ORDER — IBUPROFEN 600 MG PO TABS
600.0000 mg | ORAL_TABLET | Freq: Four times a day (QID) | ORAL | Status: DC
  Administered 2021-12-15 – 2021-12-17 (×7): 600 mg via ORAL
  Filled 2021-12-15 (×7): qty 1

## 2021-12-15 MED ORDER — FENTANYL CITRATE (PF) 100 MCG/2ML IJ SOLN
50.0000 ug | INTRAMUSCULAR | Status: DC | PRN
  Administered 2021-12-15: 50 ug via INTRAVENOUS
  Filled 2021-12-15: qty 2

## 2021-12-15 MED ORDER — TRANEXAMIC ACID-NACL 1000-0.7 MG/100ML-% IV SOLN
INTRAVENOUS | Status: AC
  Filled 2021-12-15: qty 100

## 2021-12-15 MED ORDER — ONDANSETRON HCL 4 MG/2ML IJ SOLN
4.0000 mg | INTRAMUSCULAR | Status: DC | PRN
  Filled 2021-12-15 (×2): qty 2

## 2021-12-15 MED ORDER — CEFAZOLIN SODIUM-DEXTROSE 2-4 GM/100ML-% IV SOLN
2.0000 g | Freq: Once | INTRAVENOUS | Status: DC
Start: 2021-12-15 — End: 2021-12-17

## 2021-12-15 MED ORDER — COCONUT OIL OIL: 1.0000 | TOPICAL_OIL | Status: DC | PRN

## 2021-12-15 MED ORDER — LACTATED RINGERS IV SOLN
500.0000 mL | Freq: Once | INTRAVENOUS | Status: AC
Start: 2021-12-15 — End: 2021-12-15
  Administered 2021-12-15: 500 mL via INTRAVENOUS

## 2021-12-15 MED ORDER — ACETAMINOPHEN 325 MG PO TABS
650.0000 mg | ORAL_TABLET | ORAL | Status: DC | PRN
Start: 2021-12-15 — End: 2021-12-15
  Administered 2021-12-15: 650 mg via ORAL
  Filled 2021-12-15: qty 2

## 2021-12-15 NOTE — Anesthesia Procedure Notes (Signed)
Epidural Patient location during procedure: OB Start time: 12/15/2021 7:40 AM End time: 12/15/2021 7:49 AM  Staffing Anesthesiologist: Lucretia Kern, MD Performed: anesthesiologist   Preanesthetic Checklist Completed: patient identified, IV checked, risks and benefits discussed, monitors and equipment checked, pre-op evaluation and timeout performed  Epidural Patient position: sitting Prep: DuraPrep Patient monitoring: heart rate, continuous pulse ox and blood pressure Approach: midline Location: L3-L4 Injection technique: LOR air  Needle:  Needle type: Tuohy  Needle gauge: 17 G Needle length: 9 cm Needle insertion depth: 8 cm Catheter type: closed end flexible Catheter size: 19 Gauge Catheter at skin depth: 12 cm Test dose: negative  Assessment Events: blood not aspirated, injection not painful, no injection resistance, no paresthesia and negative IV test  Additional Notes Reason for block:procedure for pain

## 2021-12-15 NOTE — Discharge Summary (Shared)
Postpartum Discharge Summary  Date of Service updated***     Patient Name: Madeline Garcia DOB: 03-14-1996 MRN: [id number]  Date of admission: 12/15/2021 Delivery date:12/15/2021  Delivering provider: Gerlene Fee  Date of discharge: 12/15/2021  Admitting diagnosis: GDM (gestational diabetes mellitus) [O24.419] Intrauterine pregnancy: [redacted]w[redacted]d    Secondary diagnosis:  Principal Problem:   GDM (gestational diabetes mellitus)  Additional problems: ***    Discharge diagnosis: Term Pregnancy Delivered, Gestational Hypertension, and GDM A2                                              Post partum procedures:{Postpartum procedures:23558} Augmentation: AROM, Pitocin, and Cytotec Complications: None  Hospital course: Induction of Labor With Vaginal Delivery   26y.o. yo [name] at 397w0das admitted to the hospital 12/15/2021 for induction of labor.  Indication for induction: A2 DM.  Patient had an uncomplicated labor course as follows: Membrane Rupture Time/Date: 2:48 PM ,12/15/2021   Delivery Method:Vaginal, Spontaneous  Episiotomy:   Lacerations:    Details of delivery can be found in separate delivery note.  Patient had a routine postpartum course. Patient is discharged home 12/15/21.  Newborn Data: Birth date:12/15/2021  Birth time:6:36 PM  Gender:Female  Living status:  Apgars: ,  Weight:   Magnesium Sulfate received: No BMZ received: No Rhophylac:No MMR:No T-DaP:{Tdap:23962} Flu: N/A Transfusion:{Transfusion received:30440034}  Physical exam  Vitals:   12/15/21 1535 12/15/21 1600 12/15/21 1630 12/15/21 1730  BP: (!) 137/95 123/71 113/61 (!) 140/81  Pulse: (!) 104 88 86 94  Resp:      Temp:      TempSrc:      SpO2:  99% 100% 100%   General: {Exam; general:21111117} Lochia: {Desc; appropriate/inappropriate:30686::"appropriate"} Uterine Fundus: {Desc; firm/soft:30687} Incision: {Exam; incision:21111123} DVT Evaluation: {Exam; dvt:2111122} Labs: Lab  Results  Component Value Date   WBC 10.7 (H) 12/15/2021   HGB 9.9 (L) 12/15/2021   HCT 29.7 (L) 12/15/2021   MCV 78.6 (L) 12/15/2021   PLT 366 12/15/2021      Latest Ref Rng & Units 12/15/2021   12:09 AM  CMP  Glucose 70 - 99 mg/dL 86   BUN 6 - 20 mg/dL 8   Creatinine 0.44 - 1.00 mg/dL 0.54   Sodium 135 - 145 mmol/L 137   Potassium 3.5 - 5.1 mmol/L 3.0   Chloride 98 - 111 mmol/L 108   CO2 22 - 32 mmol/L 19   Calcium 8.9 - 10.3 mg/dL 8.9   Total Protein 6.5 - 8.1 g/dL 6.8   Total Bilirubin 0.3 - 1.2 mg/dL 0.5   Alkaline Phos 38 - 126 U/L 183   AST 15 - 41 U/L 20   ALT 0 - 44 U/L 12    Edinburgh Score:    01/21/2019    9:17 AM  Edinburgh Postnatal Depression Scale Screening Tool  I have been able to laugh and see the funny side of things. 0  I have looked forward with enjoyment to things. 0  I have blamed myself unnecessarily when things went wrong. 0  I have been anxious or worried for no good reason. 0  I have felt scared or panicky for no good reason. 0  Things have been getting on top of me. 2  I have been so unhappy that I have had difficulty sleeping. 0  I have felt sad  or miserable. 0  I have been so unhappy that I have been crying. 0  The thought of harming myself has occurred to me. 0  Edinburgh Postnatal Depression Scale Total 2     After visit meds:  Allergies as of 12/15/2021   No Known Allergies   Med Rec must be completed prior to using this [facility]***        Discharge home in stable condition Infant Feeding: Breast Infant Disposition:home with mother Discharge instruction: per After Visit Summary and Postpartum booklet. Activity: Advance as tolerated. Pelvic rest for 6 weeks.  Diet: routine diet Future Appointments:No future appointments. Follow up Visit:  Msg sent by Gerlene Fee to [facility] 8/11 '@[phone]'   Please schedule this patient for a In person postpartum visit in 4 weeks with the following provider: MD and APP. Additional  Postpartum F/U:BP check 1 week  High risk pregnancy complicated by: GDM Delivery mode:  Vaginal, Spontaneous  Anticipated Birth Control:  Depo   12/15/2021 Madeline Plummer Autry-Lott, DO

## 2021-12-15 NOTE — Plan of Care (Signed)
  Problem: Education: Goal: Knowledge of condition will improve Outcome: Completed/Met

## 2021-12-15 NOTE — Anesthesia Preprocedure Evaluation (Signed)
Anesthesia Evaluation  Patient identified by MRN, date of birth, ID band Patient awake    Reviewed: Allergy & Precautions, H&P , NPO status , Patient's Chart, lab work & pertinent test results  History of Anesthesia Complications Negative for: history of anesthetic complications  Airway Mallampati: II  TM Distance: >3 FB     Dental   Pulmonary neg pulmonary ROS,    Pulmonary exam normal        Cardiovascular negative cardio ROS   Rhythm:regular Rate:Normal     Neuro/Psych negative neurological ROS  negative psych ROS   GI/Hepatic negative GI ROS, Neg liver ROS,   Endo/Other  diabetes, GestationalMorbid obesity  Renal/GU      Musculoskeletal   Abdominal   Peds  Hematology  (+) Blood dyscrasia, anemia ,   Anesthesia Other Findings   Reproductive/Obstetrics (+) Pregnancy                             Anesthesia Physical Anesthesia Plan  ASA: 3  Anesthesia Plan: Epidural   Post-op Pain Management:    Induction:   PONV Risk Score and Plan:   Airway Management Planned:   Additional Equipment:   Intra-op Plan:   Post-operative Plan:   Informed Consent: I have reviewed the patients History and Physical, chart, labs and discussed the procedure including the risks, benefits and alternatives for the proposed anesthesia with the patient or authorized representative who has indicated his/her understanding and acceptance.       Plan Discussed with:   Anesthesia Plan Comments:         Anesthesia Quick Evaluation

## 2021-12-15 NOTE — Inpatient Diabetes Management (Signed)
Inpatient Diabetes Program Recommendations  Diabetes Treatment Program Recommendations  ADA Standards of Care Diabetes in Pregnancy Target Glucose Ranges:  Fasting: 70 - 95 mg/dL 1 hr postprandial: Less than 140mg /dL (from first bite of meal) 2 hr postprandial: Less than 120 mg/dL (from first bite of meal)      Latest Reference Range & Units 12/15/21 00:59 12/15/21 05:03  Glucose-Capillary 70 - 99 mg/dL 02/14/22 (H) 437 (H)    Latest Reference Range & Units 06/12/21 16:10 12/13/21 16:31  Hemoglobin A1C 4.8 - 5.6 % 5.8 (H) 5.8 (H)   Review of Glycemic Control  Diabetes history: GDM; [redacted]W[redacted]D gestation Outpatient Diabetes medications: none Current orders for Inpatient glycemic control: None  Inpatient Diabetes Program Recommendations:    Insulin: Please consider ordering Novolog 0-14 units Q4H.  NOTE: In reviewing chart, noted patient admitted for induction of labor. Patient has hx of GDM but not on any outpatient DM medication. Per notes, patient was not checking glucose at home. CBGs so far today 102 and 135 mg/dl.  Would recommend ordering Novolog 0-14 units Q4H for correction. Anticipate glucose will improve following delivery and no insulin would be needed after patient delivers.   Thanks, 02/12/22, RN, MSN, CDCES Diabetes Coordinator Inpatient Diabetes Program [phone] (Team Pager from 8am to 5pm)

## 2021-12-15 NOTE — Progress Notes (Signed)
Labor Progress Note Madeline Garcia is a 26 y.o. [name] at [redacted]w[redacted]d presented for IOL.   S: Doing well. No concern. Would like to rest.   O:  BP (!) 151/98   Pulse 98   Temp 97.9 F (36.6 C) (Oral)   Resp 18   LMP 12/26/2020   SpO2 100%  EFM: 125BPM/mod/+accels, no decels  CVE: Dilation: 5.5 Effacement (%): 70 Cervical Position: Middle Station: -2 Presentation: Vertex Exam by:: Kittie Plater RN  A&P: 26 y.o. [name] [redacted]w[redacted]d IOL 2/2 uncontrolled GDM.  #Labor: Progressing well. Continue pitocin.  #Pain: Epidural #FWB: Cat I #GBS  unknown; collected 8/9  GDM CBG 91 -Continue q4h latent labor, q2h active labor  Vishaal Strollo Autry-Lott, DO 10:26 AM

## 2021-12-15 NOTE — Progress Notes (Signed)
Patient ID: Madeline Garcia, female   DOB: 1996-05-01, 26 y.o.   MRN: [id number] Sleeping at intervals  Vitals:   12/15/21 0023 12/15/21 0452 12/15/21 0454  BP: 126/77  124/62  Pulse: (!) 107  89  Resp: 18  18  Temp: 98.3 F (36.8 C) 97.9 F (36.6 C)   TempSrc: Oral Oral   SpO2: 100%     FHR reassuring UCs not tracing  Dilation: 4 Effacement (%): 50 Cervical Position: Posterior Station: Ballotable Presentation: Vertex Exam by:: Wynelle Bourgeois CNM  Will start Pitocin per low dose protocol  CBG (last 3)  Recent Labs    12/13/21 1539 12/15/21 0059  GLUCAP 71 102*

## 2021-12-15 NOTE — H&P (Signed)
Madeline Garcia is a 26 y.o. female presenting for Induction of Labor for GDM with noncompliance.  Declined to go to class or do any glucose testing at home Also missed several appointments mid-pregnancy Last US demonstrated LGA with EFW 4000gm (large AC)  Pregnancy has been followed at [facility] and remarkable for:  Patient Active Problem List   Diagnosis Date Noted   GDM (gestational diabetes mellitus) 12/15/2021   LGA (large for gestational age) fetus affecting management of mother 12/13/2021   Insufficient prenatal care 12/13/2021   Noncompliant pregnant patient in third trimester 12/13/2021   Gestational diabetes mellitus (GDM), antepartum 11/29/2021   BMI 40.0-44.9, adult (HCC) 06/13/2021   History of vacuum extraction assisted delivery 06/13/2021   Supervision of other normal pregnancy, antepartum 06/01/2021   Tinea versicolor 02/04/2019   . OB History     Gravida  2   Para  1   Term  1   Preterm      AB      Living  1      SAB      IAB      Ectopic      Multiple  0   Live Births  1          Past Medical History:  Diagnosis Date   Medical history non-contributory    Past Surgical History:  Procedure Laterality Date   NO PAST SURGERIES     Family History: family history includes Cancer in her mother. Social History:  reports that she has never smoked. She has never used smokeless tobacco. She reports that she does not drink alcohol and does not use drugs.     Maternal Diabetes: Yes:  Diabetes Type:  Diet controlled   Uncertain control, noncompliant with testing.  Genetic Screening: Normal Maternal Ultrasounds/Referrals: Other: LGA Fetal Ultrasounds or other Referrals:  None Maternal Substance Abuse:  No Significant Maternal Medications:  None Significant Maternal Lab Results:  None Number of Prenatal Visits:greater than 3 verified prenatal visits Other Comments:  None  Review of Systems  Constitutional:  Negative for chills and fever.   Eyes:  Negative for visual disturbance.  Respiratory:  Negative for shortness of breath.   Gastrointestinal:  Negative for abdominal pain, diarrhea, nausea and vomiting.  Genitourinary:  Negative for vaginal bleeding.  Neurological:  Negative for weakness and headaches.   Maternal Medical History:  Reason for admission: Nausea. Induction of Labor   Contractions: Frequency: irregular.   Perceived severity is mild.   Fetal activity: Perceived fetal activity is normal.   Last perceived fetal movement was within the past hour.   Prenatal complications: No bleeding, PIH, placental abnormality, pre-eclampsia or preterm labor.   Prenatal Complications - Diabetes: gestational. Diabetes is managed by diet.       Last menstrual period 12/26/2020. Maternal Exam:  Uterine Assessment: Contraction strength is mild.  Contraction frequency is irregular.  Abdomen: Patient reports no abdominal tenderness. Fetal presentation: vertex Introitus: Normal vulva. Normal vagina.  Ferning test: not done.  Nitrazine test: not done. Amniotic fluid character: not assessed. Pelvis: adequate for delivery.   Cervix: Cervix evaluated by digital exam.     Fetal Exam Fetal Monitor Review: Mode: ultrasound.   Baseline rate: 140.  Variability: moderate (6-25 bpm).   Pattern: accelerations present and no decelerations.   Fetal State Assessment: Category I - tracings are normal.   Physical Exam Constitutional:      General: She is not in acute distress.    Appearance: She  is not ill-appearing or toxic-appearing.  HENT:     Head: Normocephalic.  Cardiovascular:     Rate and Rhythm: Normal rate.  Pulmonary:     Effort: Pulmonary effort is normal.  Abdominal:     General: There is no distension.     Tenderness: There is no abdominal tenderness. There is no guarding.  Genitourinary:    General: Normal vulva.     Comments: Dilation: 4 Effacement (%): 50 Cervical Position: Posterior Station:  -3 Presentation: Vertex Exam by:: Mattel RN  Musculoskeletal:        General: Normal range of motion.     Cervical back: Normal range of motion.  Skin:    General: Skin is warm and dry.  Neurological:     General: No focal deficit present.     Mental Status: She is alert.  Psychiatric:        Mood and Affect: Mood normal.     Prenatal labs: ABO, Rh: B/Positive/-- (02/06 1610) Antibody: Negative (02/06 1610) Rubella: 1.10 (02/06 1610) RPR: Non Reactive (07/12 0834)  HBsAg: Negative (02/06 1610)  HIV: Non Reactive (07/12 0834)  GBS:     Assessment/Plan: [place] IUP at [redacted]w[redacted]d Gestational Diabetes, noncompliant with treatment and monitoring LGA per [place] History of GHTN, normotensive so far History of VAVD for NRFHR Unknown GBS  Admit to Labor and Delivery Routine orders Cytotec then Pitocin Anticipate SVD with SD precautions   Wynelle Bourgeois 12/15/2021, 12:18 AM

## 2021-12-15 NOTE — Progress Notes (Signed)
Called to room post delivery 2/2 continual vaginal bleeding and large clots. TXA given at delivery. EBL now 653 (total from delivery). Foley still in. Lower uterine sweep with large clots and continual trickle with fundal rub. Fundus is firm. 800 mcg rectal cytotec given. Will give ancef ppx. Consider other PPH medications if continues to have postpartum bleeding. Dr. Ladon Applebaum aware.   Lavonda Jumbo, DO OB Fellow, [facility], Center for [facility] 12/15/2021, 8:39 PM

## 2021-12-15 NOTE — Progress Notes (Signed)
Labor Progress Note Madeline Garcia is a 26 y.o. [name] at [redacted]w[redacted]d presented for IOL.   S: Doing well. No concern. Would like to rest.   O:  BP 126/76   Pulse 96   Temp 98.3 F (36.8 C) (Oral)   Resp 18   LMP 12/26/2020   SpO2 100%  EFM: 130BPM/mod/+accels, early decels  CVE: Dilation: 6 Effacement (%): 100 Cervical Position: Middle Station: -2 Presentation: Vertex Exam by:: Salvadore Dom MD  A&P: 27 y.o. [name] [redacted]w[redacted]d IOL 2/2 uncontrolled GDM.  #Labor: Progressing well. Continue pitocin. AROM, clear fluid.  #Pain: Epidural #FWB: Cat I #GBS  negative  GDM CBG 94 -Continue q4h latent labor, q2h active labor  Elevated BP readings -PCR 0.21 -CMP wnl -Continue to monitor BPs  Lavena Loretto Autry-Lott, DO 2:57 PM

## 2021-12-16 LAB — CBC
HCT: 24.7 % — ABNORMAL LOW (ref 36.0–46.0)
Hemoglobin: 8.3 g/dL — ABNORMAL LOW (ref 12.0–15.0)
MCH: 26.4 pg (ref 26.0–34.0)
MCHC: 33.6 g/dL (ref 30.0–36.0)
MCV: 78.7 fL — ABNORMAL LOW (ref 80.0–100.0)
Platelets: 283 10*3/uL (ref 150–400)
RBC: 3.14 MIL/uL — ABNORMAL LOW (ref 3.87–5.11)
RDW: 13.5 % (ref 11.5–15.5)
WBC: 13.4 10*3/uL — ABNORMAL HIGH (ref 4.0–10.5)
nRBC: 0 % (ref 0.0–0.2)

## 2021-12-16 NOTE — Anesthesia Postprocedure Evaluation (Signed)
Anesthesia Post Note  Patient: Madeline Garcia  Procedure(s) Performed: AN AD HOC LABOR EPIDURAL     Patient location during evaluation: Mother Baby Anesthesia Type: Epidural Level of consciousness: awake and alert Pain management: pain level controlled Vital Signs Assessment: post-procedure vital signs reviewed and stable Respiratory status: spontaneous breathing, nonlabored ventilation and respiratory function stable Cardiovascular status: stable Postop Assessment: no headache, no backache and epidural receding Anesthetic complications: no   No notable events documented.  Last Vitals:  Vitals:   12/16/21 0144 12/16/21 0558  BP: 112/88 (!) 109/57  Pulse: 81 78  Resp: 18 18  Temp: 36.7 C 36.8 C  SpO2:      Last Pain:  Vitals:   12/16/21 0558  TempSrc: Oral  PainSc:    Pain Goal: Patients Stated Pain Goal: 3 (12/15/21 0750)                 [facility] Records

## 2021-12-16 NOTE — Progress Notes (Signed)
POSTPARTUM PROGRESS NOTE  Post Partum Day 1  Subjective:  Madeline Garcia is a 26 y.o. M2X1155 s/p VD at [redacted]w[redacted]d.  She reports she is doing well. No acute events overnight. She denies any problems with ambulating, voiding or po intake. Denies nausea or vomiting.  Pain is well controlled.  Lochia is appropriate.  Objective: Blood pressure (!) 109/57, pulse 78, temperature 98.2 F (36.8 C), temperature source Oral, resp. rate 18, last menstrual period 12/26/2020, SpO2 100 %, unknown if currently breastfeeding.  Physical Exam:  General: alert, cooperative and no distress Chest: no respiratory distress Heart:regular rate, distal pulses intact Abdomen: soft, nontender,  Uterine Fundus: firm, appropriately tender DVT Evaluation: No calf swelling or tenderness Extremities: no edema Skin: warm, dry  Recent Labs    12/15/21 0009 12/16/21 0559  HGB 9.9* 8.3*  HCT 29.7* 24.7*    Assessment/Plan: Madeline Garcia is a 26 y.o. M0E0223 s/p VD at [redacted]w[redacted]d   PPD#1 - Doing well  Routine postpartum care  Contraception: Depo Feeding: Breast  Dispo: Plan for discharge tomorrow.   LOS: 1 day   Derrel Nip, MD  OB Fellow  12/16/2021, 6:14 AM

## 2021-12-17 LAB — CULTURE, OB URINE: Culture: >=100000 — AB

## 2021-12-17 MED ORDER — MEDROXYPROGESTERONE ACETATE 150 MG/ML IM SUSP
150.0000 mg | Freq: Once | INTRAMUSCULAR | Status: AC
Start: 2021-12-17 — End: 2021-12-17
  Administered 2021-12-17: 150 mg via INTRAMUSCULAR
  Filled 2021-12-17: qty 1

## 2021-12-17 MED ORDER — POLYSACCHARIDE IRON COMPLEX 150 MG PO CAPS: 150.0000 mg | ORAL_CAPSULE | ORAL | 0 refills | Status: DC

## 2021-12-17 MED ORDER — ACETAMINOPHEN 325 MG PO TABS: 650.0000 mg | ORAL_TABLET | ORAL | 0 refills | Status: AC | PRN

## 2021-12-17 MED ORDER — IBUPROFEN 600 MG PO TABS: 600.0000 mg | ORAL_TABLET | Freq: Four times a day (QID) | ORAL | 0 refills | Status: DC

## 2021-12-17 NOTE — Plan of Care (Signed)
  Problem: Activity: Goal: Will verbalize the importance of balancing activity with adequate rest periods Outcome: Completed/Met Goal: Ability to tolerate increased activity will improve Outcome: Completed/Met   Problem: Life Cycle: Goal: Chance of risk for complications during the postpartum period will decrease Outcome: Completed/Met   Problem: Role Relationship: Goal: Ability to demonstrate positive interaction with newborn will improve Outcome: Completed/Met   Problem: Skin Integrity: Goal: Demonstration of wound healing without infection will improve Outcome: Completed/Met

## 2021-12-19 ENCOUNTER — Ambulatory Visit: Payer: Medicaid Other

## 2021-12-19 LAB — SURGICAL PATHOLOGY

## 2021-12-20 ENCOUNTER — Encounter: Payer: Self-pay | Admitting: Certified Nurse Midwife

## 2021-12-25 ENCOUNTER — Ambulatory Visit: Payer: Medicaid Other

## 2021-12-26 ENCOUNTER — Telehealth (HOSPITAL_COMMUNITY): Payer: Self-pay

## 2021-12-26 NOTE — Telephone Encounter (Signed)
No answer. No voicemail option available.  Marcelino Duster [facility]  12/26/21,1337

## 2021-12-27 ENCOUNTER — Other Ambulatory Visit: Payer: Medicaid Other

## 2021-12-27 ENCOUNTER — Encounter: Payer: Self-pay | Admitting: Certified Nurse Midwife

## 2022-01-03 ENCOUNTER — Encounter: Payer: Self-pay | Admitting: Family Medicine

## 2022-01-03 ENCOUNTER — Other Ambulatory Visit: Payer: Self-pay

## 2022-01-22 NOTE — Progress Notes (Unsigned)
Madeline Garcia Visit Note  Madeline Garcia is a 26 y.o. G37P2002 female who presents for a postpartum visit. She is 5 weeks postpartum following a normal spontaneous vaginal delivery.  I have fully reviewed the prenatal and intrapartum course. The delivery was at 38.0 gestational weeks.  Anesthesia: none. Postpartum course has been uneventful. Baby is doing well. Baby is feeding by bottle - Similac Advance. Bleeding no bleeding. Bowel function is normal. Bladder function is normal. Patient is sexually active. Contraception method is Depo-Provera injections. Postpartum depression screening: negative.   The pregnancy intention screening data noted above was reviewed. Potential methods of contraception were discussed. The patient elected to proceed with No data recorded.   Edinburgh Postnatal Depression Scale - 01/23/22 1121       Edinburgh Postnatal Depression Scale:  In the Past 7 Days   I have been able to laugh and see the funny side of things. 0    I have looked forward with enjoyment to things. 0    I have blamed myself unnecessarily when things went wrong. 0    I have been anxious or worried for no good reason. 0    I have felt scared or panicky for no good reason. 0    Things have been getting on top of me. 0    I have been so unhappy that I have had difficulty sleeping. 0    I have felt sad or miserable. 0    I have been so unhappy that I have been crying. 0    The thought of harming myself has occurred to me. 0    Edinburgh Postnatal Depression Scale Total 0             Health Maintenance Due  Topic Date Due   COVID-19 Vaccine (3 - Pfizer series) 04/02/2020   PAP-Cervical Cytology Screening  06/25/2021   PAP SMEAR-Modifier  06/25/2021   INFLUENZA VACCINE  Never done    The following portions of the patient's history were reviewed and updated as appropriate: allergies, current medications, past family history, past medical history, past social history, past surgical  history, and problem list.  Review of Systems Pertinent items noted in HPI and remainder of comprehensive ROS otherwise negative.  Objective:  BP 114/77   Pulse 89   Wt 249 lb 3.2 oz (113 kg)   LMP  (LMP Unknown)   Breastfeeding No   BMI 42.78 kg/m    General:  alert, cooperative, and appears stated age   Breasts:  not indicated  Lungs: Comfortalbe on room air  Wound N/a  GU exam:  not indicated         Assessment:    There are no diagnoses linked to this encounter.  Normal postpartum exam.   Plan:   Essential components of care per ACOG recommendations:  1.  Mood and well being: Patient with negative depression screening today. Reviewed local resources for support.  - Patient tobacco use? No.   - hx of drug use? No.    2. Infant care and feeding:  -Patient currently breastmilk feeding? No.  -Social determinants of health (SDOH) reviewed in EPIC. No concerns  3. Sexuality, contraception and birth spacing - Patient does not want a pregnancy in the next year.  Desired family size is 2 children.  - Reviewed reproductive life planning. Reviewed contraceptive methods based on pt preferences and effectiveness.  Patient desired Hormonal Injection today.   - Discussed birth spacing of 18 months  4. Sleep and fatigue -Encouraged family/partner/community support of 4 hrs of uninterrupted sleep to help with mood and fatigue  5. Physical Recovery  - Discussed patients delivery and complications. She describes her labor as good. - Patient had a Vaginal, no problems at delivery. Patient had no laceration. Perineal healing reviewed. Patient expressed understanding - Patient has urinary incontinence? No. - Patient is safe to resume physical and sexual activity  6.  Health Maintenance - HM due items addressed No - patient deferred - Last pap smear  Diagnosis  Date Value Ref Range Status  06/25/2018   Final   NEGATIVE FOR INTRAEPITHELIAL LESIONS OR MALIGNANCY.   Pap smear  not done at today's visit. She prefers to defer until next visit, scheduled for 2 weeks from now -Breast Cancer screening indicated? No.   7. Chronic Disease/Pregnancy Condition follow up: Gestational Diabetes - scheduled for 2 wk GTT in two weeks - PCP follow up  Clarnce Flock, [place] for [place], [place]

## 2022-01-23 ENCOUNTER — Encounter: Payer: Self-pay | Admitting: Family Medicine

## 2022-01-23 ENCOUNTER — Ambulatory Visit (INDEPENDENT_AMBULATORY_CARE_PROVIDER_SITE_OTHER): Payer: Medicaid Other | Admitting: Family Medicine

## 2022-01-23 ENCOUNTER — Other Ambulatory Visit: Payer: Self-pay

## 2022-01-23 DIAGNOSIS — Z8632 Personal history of gestational diabetes: Secondary | ICD-10-CM | POA: Diagnosis not present

## 2022-02-05 ENCOUNTER — Other Ambulatory Visit: Payer: Self-pay | Admitting: General Practice

## 2022-02-05 DIAGNOSIS — Z8632 Personal history of gestational diabetes: Secondary | ICD-10-CM

## 2022-02-07 ENCOUNTER — Other Ambulatory Visit: Payer: Medicaid Other

## 2022-02-07 ENCOUNTER — Ambulatory Visit: Payer: Self-pay | Admitting: Family Medicine

## 2022-03-02 ENCOUNTER — Ambulatory Visit: Payer: Self-pay

## 2022-07-16 ENCOUNTER — Ambulatory Visit (HOSPITAL_COMMUNITY)
Admission: EM | Admit: 2022-07-16 | Discharge: 2022-07-16 | Disposition: A | Discharge status: Home / Self Care | Payer: Medicaid Other | Attending: Family Medicine | Admitting: Family Medicine

## 2022-07-16 ENCOUNTER — Encounter (HOSPITAL_COMMUNITY): Payer: Self-pay | Admitting: Emergency Medicine

## 2022-07-16 DIAGNOSIS — Z3201 Encounter for pregnancy test, result positive: Secondary | ICD-10-CM | POA: Diagnosis not present

## 2022-07-16 DIAGNOSIS — Z3A15 15 weeks gestation of pregnancy: Secondary | ICD-10-CM

## 2022-07-16 LAB — POC URINE PREG, ED: Preg Test, Ur: POSITIVE — AB

## 2022-07-16 NOTE — ED Provider Notes (Signed)
[place]    CSN: [id number] Arrival date & time: 07/16/22  1105      History   Chief Complaint Chief Complaint  Patient presents with   Possible Pregnancy    HPI Madeline Garcia is a 27 y.o. female.   Patient is here for pregnancy test.  LMP was December.  No other issues.  Needs confirmed test to see ob/gyn.        Past Medical History:  Diagnosis Date   Medical history non-contributory     Patient Active Problem List   Diagnosis Date Noted   History of gestational diabetes mellitus (GDM) 12/15/2021   LGA (large for gestational age) fetus affecting management of mother 12/13/2021   Insufficient prenatal care 12/13/2021   Noncompliant pregnant patient in third trimester 12/13/2021   BMI 40.0-44.9, adult ([place]) 06/13/2021   History of vacuum extraction assisted delivery 06/13/2021   Tinea versicolor 02/04/2019    Past Surgical History:  Procedure Laterality Date   NO PAST SURGERIES      OB History     Gravida  2   Para  2   Term  2   Preterm      AB      Living  2      SAB      IAB      Ectopic      Multiple  0   Live Births  2            Home Medications    Prior to Admission medications   Medication Sig Start Date End Date Taking? Authorizing Provider  acetaminophen (TYLENOL) 325 MG tablet Take 2 tablets (650 mg total) by mouth every 4 (four) hours as needed (for pain scale < 4). 12/17/21   Darci Current, DO  ibuprofen (ADVIL) 600 MG tablet Take 1 tablet (600 mg total) by mouth every 6 (six) hours. 12/17/21   Darci Current, DO    Family History Family History  Problem Relation Age of Onset   Cancer Mother        Breast    Social History Social History   Tobacco Use   Smoking status: Never   Smokeless tobacco: Never  Vaping Use   Vaping Use: Never used  Substance Use Topics   Alcohol use: Never   Drug use: Never     Allergies   Patient has no known allergies.   Review of Systems Review of  Systems  Constitutional: Negative.   HENT: Negative.    Respiratory: Negative.    Cardiovascular: Negative.   Gastrointestinal: Negative.   Musculoskeletal: Negative.   Psychiatric/Behavioral: Negative.       Physical Exam Triage Vital Signs ED Triage Vitals [07/16/22 1227]  Enc Vitals Group     BP 127/84     Pulse Rate 83     Resp 17     Temp 98.6 F (37 C)     Temp src      SpO2 98 %     Weight      Height      Head Circumference      Peak Flow      Pain Score 0     Pain Loc      Pain Edu?      Excl. in Organ?    No data found.  Updated Vital Signs BP 127/84 (BP Location: Left Arm)   Pulse 83   Temp 98.6 F (37 C)   Resp 17  LMP 04/06/2022   SpO2 98%   Visual Acuity Right Eye Distance:   Left Eye Distance:   Bilateral Distance:    Right Eye Near:   Left Eye Near:    Bilateral Near:     Physical Exam Constitutional:      Appearance: Normal appearance.  Cardiovascular:     Rate and Rhythm: Normal rate.  Pulmonary:     Effort: Pulmonary effort is normal.  Neurological:     General: No focal deficit present.     Mental Status: She is alert.  Psychiatric:        Mood and Affect: Mood normal.      UC Treatments / Results  Labs (all labs ordered are listed, but only abnormal results are displayed) Labs Reviewed  POC URINE PREG, ED   UPT positive EKG   Radiology No results found.  Procedures Procedures (including critical care time)  Medications Ordered in UC Medications - No data to display  Initial Impression / Assessment and Plan / UC Course  I have reviewed the triage vital signs and the nursing notes.  Pertinent labs & imaging results that were available during my care of the patient were reviewed by me and considered in my medical decision making (see chart for details).    Final Clinical Impressions(s) / UC Diagnoses   Final diagnoses:  [redacted] weeks gestation of pregnancy     Discharge Instructions      Your pregnancy  test was positive today.  Based on a last menstrual period of 04/06/22 you are about [redacted] weeks pregnant.  You should make an appointment with your ob/gyn.    ED Prescriptions   None    PDMP not reviewed this encounter.   Rondel Oh, MD 07/16/22 1241

## 2022-07-16 NOTE — ED Triage Notes (Signed)
Pt reports last menstrual cycle was in December. Wants pregnancy test. Denies any complications

## 2022-07-16 NOTE — Discharge Instructions (Signed)
Your pregnancy test was positive today.  Based on a last menstrual period of 04/06/22 you are about [redacted] weeks pregnant.  You should make an appointment with your ob/gyn.

## 2022-07-18 ENCOUNTER — Other Ambulatory Visit: Payer: Self-pay

## 2022-07-18 ENCOUNTER — Inpatient Hospital Stay (HOSPITAL_COMMUNITY): Payer: Medicaid Other

## 2022-07-18 ENCOUNTER — Inpatient Hospital Stay (HOSPITAL_COMMUNITY)
Admission: AD | Admit: 2022-07-18 | Discharge: 2022-07-18 | Disposition: A | Discharge status: Home / Self Care | Payer: Medicaid Other | Source: Home / Self Care | Attending: Family Medicine | Admitting: Family Medicine

## 2022-07-18 ENCOUNTER — Inpatient Hospital Stay (HOSPITAL_COMMUNITY)
Admission: AD | Admit: 2022-07-18 | Discharge: 2022-07-18 | Disposition: A | Discharge status: Home / Self Care | Payer: Medicaid Other | Attending: Obstetrics and Gynecology | Admitting: Obstetrics and Gynecology

## 2022-07-18 ENCOUNTER — Encounter (HOSPITAL_COMMUNITY): Payer: Self-pay | Admitting: Family Medicine

## 2022-07-18 ENCOUNTER — Encounter (HOSPITAL_COMMUNITY): Payer: Self-pay | Admitting: *Deleted

## 2022-07-18 DIAGNOSIS — B9689 Other specified bacterial agents as the cause of diseases classified elsewhere: Secondary | ICD-10-CM | POA: Diagnosis not present

## 2022-07-18 DIAGNOSIS — O209 Hemorrhage in early pregnancy, unspecified: Secondary | ICD-10-CM | POA: Diagnosis not present

## 2022-07-18 DIAGNOSIS — O3680X0 Pregnancy with inconclusive fetal viability, not applicable or unspecified: Secondary | ICD-10-CM

## 2022-07-18 DIAGNOSIS — O23592 Infection of other part of genital tract in pregnancy, second trimester: Secondary | ICD-10-CM | POA: Diagnosis not present

## 2022-07-18 DIAGNOSIS — Z3A14 14 weeks gestation of pregnancy: Secondary | ICD-10-CM | POA: Insufficient documentation

## 2022-07-18 DIAGNOSIS — O039 Complete or unspecified spontaneous abortion without complication: Secondary | ICD-10-CM | POA: Insufficient documentation

## 2022-07-18 DIAGNOSIS — O26852 Spotting complicating pregnancy, second trimester: Secondary | ICD-10-CM | POA: Diagnosis present

## 2022-07-18 DIAGNOSIS — O26851 Spotting complicating pregnancy, first trimester: Secondary | ICD-10-CM | POA: Insufficient documentation

## 2022-07-18 LAB — WET PREP, GENITAL
Sperm: NONE SEEN
Trich, Wet Prep: NONE SEEN
WBC, Wet Prep HPF POC: >=10 — AB (ref <10)
Yeast Wet Prep HPF POC: NONE SEEN

## 2022-07-18 LAB — URINALYSIS, ROUTINE W REFLEX MICROSCOPIC
Bilirubin Urine: NEGATIVE
Glucose, UA: NEGATIVE mg/dL
Ketones, ur: NEGATIVE mg/dL
Nitrite: NEGATIVE
Protein, ur: NEGATIVE mg/dL
Specific Gravity, Urine: 1.019 (ref 1.005–1.030)
pH: 5 (ref 5.0–8.0)

## 2022-07-18 LAB — CBC
HCT: 36.4 % (ref 36.0–46.0)
Hemoglobin: 11.7 g/dL — ABNORMAL LOW (ref 12.0–15.0)
MCH: 25.3 pg — ABNORMAL LOW (ref 26.0–34.0)
MCHC: 32.1 g/dL (ref 30.0–36.0)
MCV: 78.6 fL — ABNORMAL LOW (ref 80.0–100.0)
Platelets: 367 10*3/uL (ref 150–400)
RBC: 4.63 MIL/uL (ref 3.87–5.11)
RDW: 17.1 % — ABNORMAL HIGH (ref 11.5–15.5)
WBC: 8.3 10*3/uL (ref 4.0–10.5)
nRBC: 0 % (ref 0.0–0.2)

## 2022-07-18 LAB — HIV ANTIBODY (ROUTINE TESTING W REFLEX): HIV Screen 4th Generation wRfx: NONREACTIVE

## 2022-07-18 LAB — HCG, QUANTITATIVE, PREGNANCY: hCG, Beta Chain, Quant, S: 2316 m[IU]/mL — ABNORMAL HIGH (ref <5)

## 2022-07-18 MED ORDER — METRONIDAZOLE 500 MG PO TABS: 500.0000 mg | ORAL_TABLET | Freq: Two times a day (BID) | ORAL | 0 refills | Status: AC

## 2022-07-18 NOTE — MAU Provider Note (Signed)
History     [id number]  Arrival date and time: 07/18/22 1914    Chief Complaint  Patient presents with   Vaginal Bleeding     HPI Madeline Garcia is a 27 y.o. at 79w5dby LMP who presents for vaginal bleeding. Was seen in MAU this morning for vaginal spotting. Was told she'd need f/u viability ultrasound (today's scan showed 238mIUGS with yolk sac, no FP).  Reports bleeding increased around 5 pm today. Had large blood clots in the toilet. Has filled up one pad since then. Hasn't noticed any further blood clots. Reports abdominal cramping that she rates 2/10   --/--/B POS (08/11 0010)  OB History     Gravida  3   Para  2   Term  2   Preterm      AB      Living  2      SAB      IAB      Ectopic      Multiple  0   Live Births  2           Past Medical History:  Diagnosis Date   Medical history non-contributory     Past Surgical History:  Procedure Laterality Date   NO PAST SURGERIES      Family History  Problem Relation Age of Onset   Cancer Mother        Breast    Social History   Socioeconomic History   Marital status: Significant Other    Spouse name: Not on file   Number of children: Not on file   Years of education: Not on file   Highest education level: 12th grade  Occupational History   Occupation: WaBiochemist, clinical  Comment: [employer]  Tobacco Use   Smoking status: Never   Smokeless tobacco: Never  Vaping Use   Vaping Use: Never used  Substance and Sexual Activity   Alcohol use: Never   Drug use: Never   Sexual activity: Yes    Birth control/protection: None  Other Topics Concern   Not on file  Social History Narrative   Not on file   Social Determinants of Health   Financial Resource Strain: Not on file  Food Insecurity: No Food Insecurity (07/10/2021)   Hunger Vital Sign    Worried About Running Out of Food in the Last Year: Never true    Ran Out of Food in the Last Year: Never true   Transportation Needs: No Transportation Needs (07/10/2021)   PRAPARE - TrHydrologistMedical): No    Lack of Transportation (Non-Medical): No  Physical Activity: Not on file  Stress: No Stress Concern Present (12/11/2018)   [place]  Feeling of Stress : Only a little  Social Connections: Not on file  Intimate Partner Violence: Not At Risk (06/10/2018)   Humiliation, Afraid, Rape, and Kick questionnaire    Fear of Current or Ex-Partner: No    Emotionally Abused: No    Physically Abused: No    Sexually Abused: No    No Known Allergies  No current facility-administered medications on file prior to encounter.   Current Outpatient Medications on File Prior to Encounter  Medication Sig Dispense Refill   acetaminophen (TYLENOL) 325 MG tablet Take 2 tablets (650 mg total) by mouth every 4 (four) hours as needed (for pain scale < 4). 30 tablet 0   metroNIDAZOLE (  FLAGYL) 500 MG tablet Take 1 tablet (500 mg total) by mouth 2 (two) times daily. 14 tablet 0   Prenatal Vit-Fe Fumarate-FA (MULTIVITAMIN-PRENATAL) 27-0.8 MG TABS tablet Take 1 tablet by mouth daily at 12 noon.       ROS Pertinent positives and negative per HPI, all others reviewed and negative  Physical Exam   BP 130/83 (BP Location: Right Arm)   Pulse (!) 105   Temp 99.2 F (37.3 C) (Oral)   Resp 20   Ht '5\' 4"'$  (1.626 m)   Wt 116.4 kg   LMP 04/06/2022   SpO2 100%   BMI 44.05 kg/m   Patient Vitals for the past 24 hrs:  BP Temp Temp src Pulse Resp SpO2 Height Weight  07/18/22 1932 130/83 99.2 F (37.3 C) Oral (!) 105 20 100 % '5\' 4"'$  (1.626 m) 116.4 kg    Physical Exam Vitals and nursing note reviewed. Exam conducted with a chaperone present.  Constitutional:      General: She is not in acute distress.    Appearance: She is well-developed. She is not ill-appearing.  HENT:     Head: Normocephalic and atraumatic.  Eyes:      General: No scleral icterus.       Right eye: No discharge.        Left eye: No discharge.     Conjunctiva/sclera: Conjunctivae normal.  Pulmonary:     Effort: Pulmonary effort is normal. No respiratory distress.  Genitourinary:    Comments: Pelvic: large (~6 cm) gray tissue sitting in vagina. Cervix closed & minimal bleeding after tissue removed.  Neurological:     General: No focal deficit present.     Mental Status: She is alert.  Psychiatric:        Mood and Affect: Mood normal.        Behavior: Behavior normal.       Labs Results for orders placed or performed during the hospital encounter of 07/18/22 (from the past 24 hour(s))  Urinalysis, Routine w reflex microscopic -Urine, Clean Catch     Status: Abnormal   Collection Time: 07/18/22  6:25 AM  Result Value Ref Range   Color, Urine YELLOW YELLOW   APPearance CLOUDY (A) CLEAR   Specific Gravity, Urine 1.019 1.005 - 1.030   pH 5.0 5.0 - 8.0   Glucose, UA NEGATIVE NEGATIVE mg/dL   Hgb urine dipstick LARGE (A) NEGATIVE   Bilirubin Urine NEGATIVE NEGATIVE   Ketones, ur NEGATIVE NEGATIVE mg/dL   Protein, ur NEGATIVE NEGATIVE mg/dL   Nitrite NEGATIVE NEGATIVE   Leukocytes,Ua LARGE (A) NEGATIVE   RBC / HPF 0-5 0 - 5 RBC/hpf   WBC, UA 11-20 0 - 5 WBC/hpf   Bacteria, UA MANY (A) NONE SEEN   Squamous Epithelial / HPF 11-20 0 - 5 /HPF   Mucus PRESENT   CBC     Status: Abnormal   Collection Time: 07/18/22  7:08 AM  Result Value Ref Range   WBC 8.3 4.0 - 10.5 K/uL   RBC 4.63 3.87 - 5.11 MIL/uL   Hemoglobin 11.7 (L) 12.0 - 15.0 g/dL   HCT 36.4 36.0 - 46.0 %   MCV 78.6 (L) 80.0 - 100.0 fL   MCH 25.3 (L) 26.0 - 34.0 pg   MCHC 32.1 30.0 - 36.0 g/dL   RDW 17.1 (H) 11.5 - 15.5 %   Platelets 367 150 - 400 K/uL   nRBC 0.0 0.0 - 0.2 %  hCG, quantitative, pregnancy  Status: Abnormal   Collection Time: 07/18/22  7:08 AM  Result Value Ref Range   hCG, Beta Chain, Quant, S 2,316 (H) <5 mIU/mL  HIV Antibody (routine testing w  rflx)     Status: None   Collection Time: 07/18/22  7:08 AM  Result Value Ref Range   HIV Screen 4th Generation wRfx Non Reactive Non Reactive  Wet prep, genital     Status: Abnormal   Collection Time: 07/18/22  7:15 AM  Result Value Ref Range   Yeast Wet Prep HPF POC NONE SEEN NONE SEEN   Trich, Wet Prep NONE SEEN NONE SEEN   Clue Cells Wet Prep HPF POC PRESENT (A) NONE SEEN   WBC, Wet Prep HPF POC >=10 (A) <10   Sperm NONE SEEN     Imaging US OB Transvaginal  Result Date: 07/18/2022 CLINICAL DATA:  Vaginal bleeding EXAM: TRANSVAGINAL OB ULTRASOUND TECHNIQUE: Transvaginal ultrasound was performed for complete evaluation of the gestation as well as the maternal uterus, adnexal regions, and pelvic cul-de-sac. COMPARISON:  07/18/2022 ultrasound FINDINGS: Intrauterine gestational sac: Not visualized Yolk sac:  Not visualized Embryo:  Not visualized Maternal uterus/adnexae: Ovaries are within normal limits. Right ovary measures 2.5 by 3.9 x 3.4 cm. The left ovary measures 4.5 x 2 x 1.9 cm. Trace free fluid IMPRESSION: The previously noted intrauterine gestational sac and yolk sac are no longer visualized consistent with interval miscarriage. Trace free fluid. Electronically Signed   By: Donavan Foil M.D.   On: 07/18/2022 20:31   US OB LESS THAN 14 WEEKS WITH OB TRANSVAGINAL  Result Date: 07/18/2022 CLINICAL DATA:  Vaginal bleeding affecting first trimester pregnancy EXAM: OBSTETRIC <14 WK ULTRASOUND TECHNIQUE: Transabdominal ultrasound was performed for evaluation of the gestation as well as the maternal uterus and adnexal regions. COMPARISON:  12/13/2021 FINDINGS: Intrauterine gestational sac: Single Yolk sac:  Visualized and 7 mm. MSD:  21 mm   7 w   0 d Subchorionic hemorrhage:  None visualized. Maternal uterus/adnexae: No unexpected finding. IMPRESSION: 1. Intrauterine gestational sac with yolk sac. 2. Borderline enlarged yolk sac and 21 mm mean sac diameter without visible embryo are findings  suspicious for non-viability. Recommend follow-up. This recommendation follows SRU consensus guidelines: Diagnostic Criteria for Nonviable Pregnancy Early in the First Trimester. Alta Corning Med [id number]. Electronically Signed   By: Jorje Guild M.D.   On: 07/18/2022 07:41    MAU Course  Procedures Lab Orders  No laboratory test(s) ordered today   No orders of the defined types were placed in this encounter.  Imaging Orders         US OB Transvaginal      MDM Low  Assessment and Plan   1. Miscarriage at 8 to [redacted] weeks gestation    -Ultrasound this morning showed IUGS with yolk sac measuring 21 mm. On exam POCs sitting in vagina. Repeat ultrasound shows IUGS no longer seen confirming completed miscarriage.  She is RH positive. Hemodynamically stable. Reviewed pelvic rest & reasons to return to MAU. -Patient appropriately upset. Offered 2 week miscarriage follow up. Patient does not want follow up but significant other would like her to have follow up - office will reach out to the patient later to see if she's interested in f/u appt.      Dispo: discharged to home in stable condition.   Discharge Instructions     Discharge patient   Complete by: As directed    Discharge disposition: 01-Home or Self Care  Discharge patient date: 07/18/2022       Jorje Guild, NP 07/18/22 8:48 PM  Allergies as of 07/18/2022   No Known Allergies      Medication List     TAKE these medications    acetaminophen 325 MG tablet Commonly known as: Tylenol Take 2 tablets (650 mg total) by mouth every 4 (four) hours as needed (for pain scale < 4).   metroNIDAZOLE 500 MG tablet Commonly known as: FLAGYL Take 1 tablet (500 mg total) by mouth 2 (two) times daily.   multivitamin-prenatal 27-0.8 MG Tabs tablet Take 1 tablet by mouth daily at 12 noon.

## 2022-07-18 NOTE — MAU Provider Note (Signed)
[facility]     CSN: [id number]  Arrival date and time: 07/18/22 0602   Event Date/Time   First Provider Initiated Contact with Patient 07/18/22 [phone]      Chief Complaint  Patient presents with   Vaginal Bleeding   Madeline Garcia is a 27 y.o. year old [id number] female at 32w5dweeks gestation who presents to MAU reporting pink spotting after SI yesterday. When she woke up this morning, she had BRB. She reports occasional cramping, but none now.   OB History     Gravida  3   Para  2   Term  2   Preterm      AB      Living  2      SAB      IAB      Ectopic      Multiple  0   Live Births  2           Past Medical History:  Diagnosis Date   Medical history non-contributory     Past Surgical History:  Procedure Laterality Date   NO PAST SURGERIES      Family History  Problem Relation Age of Onset   Cancer Mother        Breast    Social History   Tobacco Use   Smoking status: Never   Smokeless tobacco: Never  Vaping Use   Vaping Use: Never used  Substance Use Topics   Alcohol use: Never   Drug use: Never    Allergies: No Known Allergies  Medications Prior to Admission  Medication Sig Dispense Refill Last Dose   Prenatal Vit-Fe Fumarate-FA (MULTIVITAMIN-PRENATAL) 27-0.8 MG TABS tablet Take 1 tablet by mouth daily at 12 noon.      acetaminophen (TYLENOL) 325 MG tablet Take 2 tablets (650 mg total) by mouth every 4 (four) hours as needed (for pain scale < 4). 30 tablet 0    ibuprofen (ADVIL) 600 MG tablet Take 1 tablet (600 mg total) by mouth every 6 (six) hours. 30 tablet 0     Review of Systems  Constitutional: Negative.   HENT: Negative.    Eyes: Negative.   Respiratory: Negative.    Cardiovascular: Negative.   Gastrointestinal: Negative.   Endocrine: Negative.   Genitourinary:  Positive for vaginal bleeding ("pink spotting after SI yesterday, but BRB this AM").  Musculoskeletal: Negative.   Skin: Negative.    Allergic/Immunologic: Negative.   Neurological: Negative.   Hematological: Negative.   Psychiatric/Behavioral: Negative.     Physical Exam   Blood pressure 124/73, pulse 80, temperature 99.2 F (37.3 C), resp. rate 17, height '5\' 4"'$  (1.626 m), weight 116.6 kg, last menstrual period 04/06/2022, SpO2 100 %, not currently breastfeeding.  Physical Exam Vitals and nursing note reviewed. Exam conducted with a chaperone present.  Constitutional:      Appearance: Normal appearance. She is obese.  Cardiovascular:     Rate and Rhythm: Normal rate.  Pulmonary:     Effort: Pulmonary effort is normal.  Abdominal:     Palpations: Abdomen is soft.  Genitourinary:    General: Normal vulva.     Comments: Pelvic exam: External genitalia normal, SE: vaginal walls pink and well rugated, cervix is smooth, pink, no lesions, scant amt of dark,red blood -- WP, GC/CT done, cervix visually closed, Uterus is non-tender, no CMT or friability, no adnexal tenderness.  Musculoskeletal:        General: Normal range of motion.  Skin:  General: Skin is warm and dry.  Neurological:     Mental Status: She is alert and oriented to person, place, and time.  Psychiatric:        Mood and Affect: Mood normal.        Behavior: Behavior normal.        Thought Content: Thought content normal.        Judgment: Judgment normal.    MAU Course  Procedures  MDM CCUA UCx-- Results pending  UPT CBC ABO/Rh -- not drawn, known B POS HCG Wet Prep GC/CT -- pending HIV -- pending OB < 14 wks [place] with TV  Results for orders placed or performed during the hospital encounter of 07/18/22 (from the past 24 hour(s))  Urinalysis, Routine w reflex microscopic -Urine, Clean Catch     Status: Abnormal   Collection Time: 07/18/22  6:25 AM  Result Value Ref Range   Color, Urine YELLOW YELLOW   APPearance CLOUDY (A) CLEAR   Specific Gravity, Urine 1.019 1.005 - 1.030   pH 5.0 5.0 - 8.0   Glucose, UA NEGATIVE NEGATIVE mg/dL    Hgb urine dipstick LARGE (A) NEGATIVE   Bilirubin Urine NEGATIVE NEGATIVE   Ketones, ur NEGATIVE NEGATIVE mg/dL   Protein, ur NEGATIVE NEGATIVE mg/dL   Nitrite NEGATIVE NEGATIVE   Leukocytes,Ua LARGE (A) NEGATIVE   RBC / HPF 0-5 0 - 5 RBC/hpf   WBC, UA 11-20 0 - 5 WBC/hpf   Bacteria, UA MANY (A) NONE SEEN   Squamous Epithelial / HPF 11-20 0 - 5 /HPF   Mucus PRESENT   CBC     Status: Abnormal   Collection Time: 07/18/22  7:08 AM  Result Value Ref Range   WBC 8.3 4.0 - 10.5 K/uL   RBC 4.63 3.87 - 5.11 MIL/uL   Hemoglobin 11.7 (L) 12.0 - 15.0 g/dL   HCT 36.4 36.0 - 46.0 %   MCV 78.6 (L) 80.0 - 100.0 fL   MCH 25.3 (L) 26.0 - 34.0 pg   MCHC 32.1 30.0 - 36.0 g/dL   RDW 17.1 (H) 11.5 - 15.5 %   Platelets 367 150 - 400 K/uL   nRBC 0.0 0.0 - 0.2 %  Wet prep, genital     Status: Abnormal   Collection Time: 07/18/22  7:15 AM  Result Value Ref Range   Yeast Wet Prep HPF POC NONE SEEN NONE SEEN   Trich, Wet Prep NONE SEEN NONE SEEN   Clue Cells Wet Prep HPF POC PRESENT (A) NONE SEEN   WBC, Wet Prep HPF POC >=10 (A) <10   Sperm NONE SEEN     US OB LESS THAN 14 WEEKS WITH OB TRANSVAGINAL  Result Date: 07/18/2022 CLINICAL DATA:  Vaginal bleeding affecting first trimester pregnancy EXAM: OBSTETRIC <14 WK ULTRASOUND TECHNIQUE: Transabdominal ultrasound was performed for evaluation of the gestation as well as the maternal uterus and adnexal regions. COMPARISON:  12/13/2021 FINDINGS: Intrauterine gestational sac: Single Yolk sac:  Visualized and 7 mm. MSD:  21 mm   7 w   0 d Subchorionic hemorrhage:  None visualized. Maternal uterus/adnexae: No unexpected finding. IMPRESSION: 1. Intrauterine gestational sac with yolk sac. 2. Borderline enlarged yolk sac and 21 mm mean sac diameter without visible embryo are findings suspicious for non-viability. Recommend follow-up. This recommendation follows SRU consensus guidelines: Diagnostic Criteria for Nonviable Pregnancy Early in the First Trimester. [facility] Corning Med [id number]. Electronically Signed   By: Jorje Guild M.D.   On: 07/18/2022  07:41      Assessment and Plan  1. Vaginal bleeding affecting early pregnancy  - Information provided on vaginal bleeding   2. Pregnancy with uncertain fetal viability, single or unspecified fetus - US OB Transvaginal; Future in 2 wks - Message sent to [facility]  3. Bacterial vaginosis - Rx: Flagyl 500 mg po BID x 7 days - Patient aware of BV d/t previous experiences  4. [redacted] weeks gestation of pregnancy   - Discharge patient - Patient verbalized an understanding of the plan of care and agrees.   Laury Deep, CNM 07/18/2022, 6:56 AM

## 2022-07-18 NOTE — MAU Note (Addendum)
.  Madeline Garcia is a 27 y.o. at [redacted]w[redacted]d here in MAU reporting: here this AM in MAU for VB - was sent home and told everything was normal. Reports VB increased about 2 hours ago with clots. Has used 1 pad in the past hour. Reports lower abdominal cramping.    Onset of complaint: 1730 Pain score: 2 Vitals:   07/18/22 1932  BP: 130/83  Pulse: (!) 105  Resp: 20  Temp: 99.2 F (37.3 C)  SpO2: 100%     FHT:NA  Lab orders placed from triage:

## 2022-07-18 NOTE — MAU Note (Signed)
.  Madeline Garcia is a 27 y.o. at [redacted]w[redacted]d here in MAU reporting pink spotting yesterday but when she went to the BR this am the bleeding was bright red. Did have intercourse before spotting started yesterday. Occ light cramps but no pain currently.  LMP: 04/06/22 Onset of complaint: yesterday Pain score: 0 Vitals:   07/18/22 0608 07/18/22 0611  BP:  119/73  Pulse: 100   Resp: 17   Temp: 99.2 F (37.3 C)   SpO2: 100%      KTC:CEQFDV to hear in triage Lab orders placed from triage:  u/a

## 2022-07-18 NOTE — Discharge Instructions (Signed)
Return to MAU: °If you have heavier bleeding that soaks through more that 2 pads per hour for an hour or more °If you bleed so much that you feel like you might pass out or you do pass out °If you have significant abdominal pain that is not improved with Tylenol 1000 mg every 8 hours as needed for pain °If you develop a fever > 100.5 °

## 2022-07-19 LAB — GC/CHLAMYDIA PROBE AMP (~~LOC~~) NOT AT ARMC
Chlamydia: NEGATIVE
Comment: NEGATIVE
Comment: NORMAL
Neisseria Gonorrhea: NEGATIVE

## 2022-07-20 ENCOUNTER — Encounter: Payer: Self-pay | Admitting: Obstetrics and Gynecology

## 2022-07-20 ENCOUNTER — Other Ambulatory Visit: Payer: Self-pay | Admitting: Obstetrics and Gynecology

## 2022-07-20 DIAGNOSIS — O2342 Unspecified infection of urinary tract in pregnancy, second trimester: Secondary | ICD-10-CM

## 2022-07-20 LAB — CULTURE, OB URINE: Culture: >=100000 — AB

## 2022-07-20 MED ORDER — CEFADROXIL 500 MG PO CAPS: 500.0000 mg | ORAL_CAPSULE | Freq: Two times a day (BID) | ORAL | 0 refills | Status: AC

## 2023-03-22 DIAGNOSIS — R0981 Nasal congestion: Secondary | ICD-10-CM | POA: Diagnosis not present

## 2023-03-22 DIAGNOSIS — R059 Cough, unspecified: Secondary | ICD-10-CM | POA: Diagnosis not present

## 2023-03-22 DIAGNOSIS — Z1152 Encounter for screening for COVID-19: Secondary | ICD-10-CM | POA: Diagnosis not present

## 2023-03-22 DIAGNOSIS — B974 Respiratory syncytial virus as the cause of diseases classified elsewhere: Secondary | ICD-10-CM | POA: Diagnosis not present

## 2023-03-22 DIAGNOSIS — B338 Other specified viral diseases: Secondary | ICD-10-CM | POA: Diagnosis not present

## 2023-03-22 DIAGNOSIS — Z20822 Contact with and (suspected) exposure to covid-19: Secondary | ICD-10-CM | POA: Diagnosis not present

## 2023-03-22 DIAGNOSIS — R509 Fever, unspecified: Secondary | ICD-10-CM | POA: Diagnosis not present

## 2023-04-25 DIAGNOSIS — Z202 Contact with and (suspected) exposure to infections with a predominantly sexual mode of transmission: Secondary | ICD-10-CM | POA: Diagnosis not present
